# Patient Record
Sex: Female | Born: 1960
Health system: Southern US, Community
[De-identification: ages and names within clinical notes are randomized; demographics above are authoritative.]

## PROBLEM LIST (undated history)

## (undated) DIAGNOSIS — K219 Gastro-esophageal reflux disease without esophagitis: Secondary | ICD-10-CM

## (undated) DIAGNOSIS — E785 Hyperlipidemia, unspecified: Secondary | ICD-10-CM

## (undated) DIAGNOSIS — I2510 Atherosclerotic heart disease of native coronary artery without angina pectoris: Secondary | ICD-10-CM

## (undated) HISTORY — PX: ABDOMINAL HYSTERECTOMY: SHX81

## (undated) HISTORY — DX: Hyperlipidemia, unspecified: E78.5

## (undated) HISTORY — PX: CHOLECYSTECTOMY: SHX55

## (undated) HISTORY — DX: Gastro-esophageal reflux disease without esophagitis: K21.9

## (undated) HISTORY — PX: TONSILLECTOMY: SUR1361

## (undated) HISTORY — DX: Atherosclerotic heart disease of native coronary artery without angina pectoris: I25.10

---

## 2014-06-30 ENCOUNTER — Ambulatory Visit (INDEPENDENT_AMBULATORY_CARE_PROVIDER_SITE_OTHER): Payer: Federal, State, Local not specified - PPO

## 2014-06-30 DIAGNOSIS — R52 Pain, unspecified: Secondary | ICD-10-CM

## 2014-06-30 DIAGNOSIS — M7730 Calcaneal spur, unspecified foot: Secondary | ICD-10-CM

## 2014-06-30 DIAGNOSIS — M722 Plantar fascial fibromatosis: Secondary | ICD-10-CM

## 2014-06-30 MED ORDER — MELOXICAM 15 MG PO TABS: 15.0000 mg | ORAL_TABLET | Freq: Every day | ORAL | Status: DC

## 2014-06-30 NOTE — Progress Notes (Signed)
   Subjective:    Patient ID: Allison Pacheco, female    DOB: 04/15/1961, 54 y.o.   MRN: [id number]  HPI  PT STATED B/L BOTTOM OF THE FOOT ARE ACHING FOR 2 MONTHS. FEET BEEN THE SAME BUT SOMETIMES WHEN WALKING THEY GET WORSE. TRIED NO TREATMENT.  Review of Systems  All other systems reviewed and are negative.      Objective:   Physical Exam 54 year old white female well-developed well-nourished oriented 3 presents this time with bilateral mid arch pain. Been hurting for couple of months now has had plantar fasciitis treated more than 5 years ago wearing a pair of Brooks athletic shoes which are good and stable however around the house is wearing some memory foam slippers. Objective findings as follows vascular status is intact pedal pulses palpable DP and PT +2 over 4 bilateral capillary fill time 3 seconds all digits. Epicritic and proprioceptive sensations intact and symmetric bilateral there is normal plantar response DTRs not listed. Dermatologically skin color pigment normal hair growth absent nail somewhat criptotic incurvated otherwise unremarkable there is pain of the mid band plantar fascia from mid arch no significant pain in the calcaneal tubercle area x-rays reveal mild fascial thickening mid arch mid band there is some inferior calcaneal spurring no signs of fracture or osseous abnormality mild promontory changes noted clinically and radiographically.       Assessment & Plan:  Assessment this time findings consistent with plantar fasciitis/heel spur syndrome recurrence plan at this time fascial strapping applied to both feet maintaining good stable shoe avoid any barefoot flimsy shoes or flip-flops recheck in 2 weeks for follow-up may be candidate for orthoses based on progress and improvement. Also recommended ice to the heel area begin meloxicam once daily patient is also take an aspirin a day should not have major interference however discontinue if any GI upset other symptoms  develop.  Alvan Dameichard Arius Harnois DPM

## 2014-06-30 NOTE — Patient Instructions (Signed)

## 2014-07-15 ENCOUNTER — Ambulatory Visit: Payer: Federal, State, Local not specified - PPO

## 2014-07-29 ENCOUNTER — Ambulatory Visit (INDEPENDENT_AMBULATORY_CARE_PROVIDER_SITE_OTHER): Payer: Federal, State, Local not specified - PPO

## 2014-07-29 VITALS — BP 138/76 | HR 93 | Resp 18

## 2014-07-29 DIAGNOSIS — M7730 Calcaneal spur, unspecified foot: Secondary | ICD-10-CM

## 2014-07-29 DIAGNOSIS — R52 Pain, unspecified: Secondary | ICD-10-CM

## 2014-07-29 DIAGNOSIS — M722 Plantar fascial fibromatosis: Secondary | ICD-10-CM

## 2014-07-29 NOTE — Progress Notes (Signed)
   Subjective:    Patient ID: Wallene DalesMeledy Campoverde, female    DOB: 1961-06-06, 54 y.o.   MRN: [id number]  HPI BOTH OF MY HEELS ARE DOING BETTER AND GOT SOME BROOK SHOES AND THE TAPE DID HELP    Review of Systems no new findings or systemic changes are noted     Objective:   Physical Exam Neurovascular status is intact and unchanged pedal pulses are palpable on exam at this time there still some tenderness in the mid band plantar fascia bilateral heels left more so than right although greatly improved from initial presentation the strapping made a big difference when it was in place continue to take NSAIDs and maintaining ice as needed wearing a good pair of new balance athletic shoes at this time. Patient demonstrated significant improvement with biomechanical support of the taping would benefit from functional orthoses at this time. Patient is advised the medication and taping helped reduce the pain symptomology orthoses with beneficial in maintaining that improvement       Assessment & Plan:  Assessment plantar fasciitis/heel spur syndrome bilateral plan at this time based on fascial strapping success functional orthotics scan is carried out at this time and use sore with a type orthotic with extrinsic rear foot post pelite top cover and padding up into the toe extension to the toes is ordered a prescription for orthotic is given and instructions are given for orthotics scan open in 4 weeks for orthotic pickup and fitting once ready to dispensed next  Alvan Dameichard Issak Goley DPM

## 2014-07-29 NOTE — Patient Instructions (Signed)

## 2014-08-26 ENCOUNTER — Ambulatory Visit: Payer: Federal, State, Local not specified - PPO

## 2014-09-01 ENCOUNTER — Ambulatory Visit: Payer: Federal, State, Local not specified - PPO

## 2014-09-01 ENCOUNTER — Ambulatory Visit (INDEPENDENT_AMBULATORY_CARE_PROVIDER_SITE_OTHER): Payer: Federal, State, Local not specified - PPO

## 2014-09-01 VITALS — BP 110/61 | HR 82 | Resp 18

## 2014-09-01 DIAGNOSIS — R52 Pain, unspecified: Secondary | ICD-10-CM | POA: Diagnosis not present

## 2014-09-01 DIAGNOSIS — M7730 Calcaneal spur, unspecified foot: Secondary | ICD-10-CM | POA: Diagnosis not present

## 2014-09-01 DIAGNOSIS — M722 Plantar fascial fibromatosis: Secondary | ICD-10-CM

## 2014-09-01 NOTE — Patient Instructions (Addendum)
PATIENT CAME BY TOE GET THE INSERTS AND INSTRUCTIONS WERE GIVEN. LISAWEARING INSTRUCTIONS FOR ORTHOTICS  Don't expect to be comfortable wearing your orthotic devices for the first time.  Like eyeglasses, you may be aware of them as time passes, they will not be uncomfortable and you will enjoy wearing them.  FOLLOW THESE INSTRUCTIONS EXACTLY!  1. Wear your orthotic devices for:       Not more than 1 hour the first day.       Not more than 2 hours the second day.       Not more than 3 hours the third day and so on.        Or wear them for as long as they feel comfortable.       If you experience discomfort in your feet or legs take them out.  When feet & legs feel       better, put them back in.  You do need to be consistent and wear them a little        everyday. 2.   If at any time the orthotic devices become acutely uncomfortable before the       time for that particular day, STOP WEARING THEM. 3.   On the next day, do not increase the wearing time. 4.   Subsequently, increase the wearing time by 15-30 minutes only if comfortable to do       so. 5.   You will be seen by your doctor about 2-4 weeks after you receive your orthotic       devices, at which time you will probably be wearing your devices comfortably        for about 8 hours or more a day. 6.   Some patients occasionally report mild aches or discomfort in other parts of the of       body such as the knees, hips or back after 3 or 4 consecutive hours of wear.  If this       is the case with you, do not extend your wearing time.  Instead, cut it back an hour or       two.  In all likelihood, these symptoms will disappear in a short period of time as your       body posture realigns itself and functions more efficiently. 7.   It is possible that your orthotic device may require some small changes or adjustment       to improve their function or make them more comfortable.   This is usually not done       before one to three  months have elapsed.  These adjustments are made in        accordance with the changed position your feet are assuming as a result of       improved biomechanical function. 8.   In women's shoes, it's not unusual for your heel to slip out of the shoe, particularly if       they are step-in-shoes.  If this is the case, try other shoes or other styles.  Try to       purchase shoes which have deeper heal seats or higher heel counters. 9.   Squeaking of orthotics devices in the shoes is due to the movement of the devices       when they are functioning normally.  To eliminate squeaking, simply dust some       baby powder into your shoes before inserting the  devices.  If this does not work,        apply soap or wax to the edges of the orthotic devices or put a tissue into the shoes. 10. It is important that you follow these directions explicitly.  Failure to do so will simply       prolong the adjustment period or create problems which are easily avoided.  It makes       no difference if you are wearing your orthotic devices for only a few hours after        several months, so long as you are wearing them comfortably for those hours. 11. If you have any questions or complaints, contact our office.  We have no way of       knowing about your problems unless you tell us.  If we do not hear from you, we will       assume that you are proceeding well.

## 2014-09-01 NOTE — Progress Notes (Signed)
   Subjective:    Patient ID: Allison Pacheco, female    DOB: [birth year], 54 y.o.   MRN: [id number]  HPI I AM HERE TO GET MY INSERTS AND MY FEET ARE DOING GOOD    Review of Systems no new findings or systemic changes     Objective:   Physical Exam Neurovascular status unchanged pedal pulses are palpable epicritic sensations intact orthotics are dispensed with break in wearing instructions a fit and contour well with full contact of the foot and arch.       Assessment & Plan:  Assessment plantar fasciitis/heel spur syndrome responded the strapping should respond to functional orthoses orthotics are dispensed with break in wearing instructions were or written instructions are given at this time recheck in a month or 2 for adjustments if needed based on progress retained orthotics appropriate shoes at all times  Alvan Dameichard Cherylene Ferrufino DPM

## 2014-10-13 ENCOUNTER — Ambulatory Visit (INDEPENDENT_AMBULATORY_CARE_PROVIDER_SITE_OTHER): Payer: Federal, State, Local not specified - PPO | Admitting: Podiatrist

## 2014-10-13 ENCOUNTER — Encounter: Payer: Self-pay | Admitting: Podiatrist

## 2014-10-13 ENCOUNTER — Ambulatory Visit (INDEPENDENT_AMBULATORY_CARE_PROVIDER_SITE_OTHER): Payer: Federal, State, Local not specified - PPO

## 2014-10-13 VITALS — BP 117/76 | HR 89 | Resp 18

## 2014-10-13 DIAGNOSIS — R52 Pain, unspecified: Secondary | ICD-10-CM | POA: Diagnosis not present

## 2014-10-13 DIAGNOSIS — M25579 Pain in unspecified ankle and joints of unspecified foot: Secondary | ICD-10-CM

## 2014-10-13 DIAGNOSIS — M722 Plantar fascial fibromatosis: Secondary | ICD-10-CM

## 2014-10-13 MED ORDER — METHYLPREDNISOLONE 4 MG PO TBPK
ORAL_TABLET | ORAL | Status: DC
Start: 2014-10-13 — End: 2017-01-22

## 2014-10-13 NOTE — Progress Notes (Signed)
Chief Complaint  Patient presents with  . Foot Problem    my left foot is hurting and hurts on the arch and the side of my foot and has been going on for about 4 weeks        Objective:   Physical Exam Neurovascular status is intact and unchanged.  Pain on the lateral aspect of the left foot is noted. No mid foot arch pain is present. Compensation type his comfort is noted.      Assessment & Plan:  Assessment fasciitis with compensation lateral aspect left foot  Plan: Recommended adjustment of the orthotics. The arch height is about 116 inch higher on the left arch than the right which appears to be causing her to supinate more than she needs to. We'll going to deepen the heel seat and also add 1/8 inch heel lift on the right side. She'll be seen when the orthotics are ready for pickup.

## 2014-11-11 ENCOUNTER — Encounter: Payer: Self-pay | Admitting: Podiatrist

## 2014-11-11 ENCOUNTER — Ambulatory Visit (INDEPENDENT_AMBULATORY_CARE_PROVIDER_SITE_OTHER): Payer: Federal, State, Local not specified - PPO | Admitting: Podiatrist

## 2014-11-11 VITALS — BP 118/67 | HR 83 | Resp 18

## 2014-11-11 DIAGNOSIS — M722 Plantar fascial fibromatosis: Secondary | ICD-10-CM

## 2014-11-15 NOTE — Progress Notes (Signed)
Orthotics dispensed by ma.  I did not personally examine this patient and this was a no charge pick up inserts visit.

## 2015-12-21 ENCOUNTER — Encounter: Payer: Self-pay | Admitting: Cardiology

## 2015-12-21 DIAGNOSIS — E781 Pure hyperglyceridemia: Secondary | ICD-10-CM

## 2015-12-21 DIAGNOSIS — I2510 Atherosclerotic heart disease of native coronary artery without angina pectoris: Secondary | ICD-10-CM

## 2015-12-21 DIAGNOSIS — K219 Gastro-esophageal reflux disease without esophagitis: Secondary | ICD-10-CM

## 2015-12-21 DIAGNOSIS — E785 Hyperlipidemia, unspecified: Secondary | ICD-10-CM

## 2015-12-21 HISTORY — DX: Gastro-esophageal reflux disease without esophagitis: K21.9

## 2015-12-21 HISTORY — DX: Hyperlipidemia, unspecified: E78.5

## 2015-12-21 HISTORY — DX: Pure hyperglyceridemia: E78.1

## 2015-12-21 HISTORY — DX: Atherosclerotic heart disease of native coronary artery without angina pectoris: I25.10

## 2016-01-10 ENCOUNTER — Encounter: Payer: Self-pay | Admitting: Cardiology

## 2016-01-10 DIAGNOSIS — I2510 Atherosclerotic heart disease of native coronary artery without angina pectoris: Secondary | ICD-10-CM | POA: Diagnosis not present

## 2016-01-12 DIAGNOSIS — E781 Pure hyperglyceridemia: Secondary | ICD-10-CM | POA: Diagnosis not present

## 2016-01-12 DIAGNOSIS — E785 Hyperlipidemia, unspecified: Secondary | ICD-10-CM | POA: Diagnosis not present

## 2016-07-03 DIAGNOSIS — E785 Hyperlipidemia, unspecified: Secondary | ICD-10-CM | POA: Diagnosis not present

## 2016-07-03 DIAGNOSIS — I2510 Atherosclerotic heart disease of native coronary artery without angina pectoris: Secondary | ICD-10-CM | POA: Diagnosis not present

## 2016-07-03 DIAGNOSIS — E781 Pure hyperglyceridemia: Secondary | ICD-10-CM | POA: Diagnosis not present

## 2016-07-03 DIAGNOSIS — K219 Gastro-esophageal reflux disease without esophagitis: Secondary | ICD-10-CM | POA: Diagnosis not present

## 2016-07-05 DIAGNOSIS — R509 Fever, unspecified: Secondary | ICD-10-CM | POA: Diagnosis not present

## 2016-07-05 DIAGNOSIS — J0100 Acute maxillary sinusitis, unspecified: Secondary | ICD-10-CM | POA: Diagnosis not present

## 2016-07-05 DIAGNOSIS — J0110 Acute frontal sinusitis, unspecified: Secondary | ICD-10-CM | POA: Diagnosis not present

## 2016-09-03 DIAGNOSIS — E781 Pure hyperglyceridemia: Secondary | ICD-10-CM | POA: Diagnosis not present

## 2016-09-03 DIAGNOSIS — E785 Hyperlipidemia, unspecified: Secondary | ICD-10-CM | POA: Diagnosis not present

## 2016-09-03 DIAGNOSIS — I2510 Atherosclerotic heart disease of native coronary artery without angina pectoris: Secondary | ICD-10-CM | POA: Diagnosis not present

## 2016-09-03 DIAGNOSIS — K219 Gastro-esophageal reflux disease without esophagitis: Secondary | ICD-10-CM | POA: Diagnosis not present

## 2016-10-03 DIAGNOSIS — J0100 Acute maxillary sinusitis, unspecified: Secondary | ICD-10-CM | POA: Diagnosis not present

## 2016-10-03 DIAGNOSIS — J209 Acute bronchitis, unspecified: Secondary | ICD-10-CM | POA: Diagnosis not present

## 2016-10-17 DIAGNOSIS — E785 Hyperlipidemia, unspecified: Secondary | ICD-10-CM | POA: Diagnosis not present

## 2017-01-29 ENCOUNTER — Ambulatory Visit (INDEPENDENT_AMBULATORY_CARE_PROVIDER_SITE_OTHER): Payer: Federal, State, Local not specified - PPO | Admitting: Cardiology

## 2017-01-29 ENCOUNTER — Encounter: Payer: Self-pay | Admitting: Cardiology

## 2017-01-29 VITALS — BP 110/66 | HR 80 | Resp 10 | Ht 64.0 in | Wt 179.8 lb

## 2017-01-29 DIAGNOSIS — E781 Pure hyperglyceridemia: Secondary | ICD-10-CM

## 2017-01-29 DIAGNOSIS — I2510 Atherosclerotic heart disease of native coronary artery without angina pectoris: Secondary | ICD-10-CM | POA: Diagnosis not present

## 2017-01-29 DIAGNOSIS — K219 Gastro-esophageal reflux disease without esophagitis: Secondary | ICD-10-CM

## 2017-01-29 DIAGNOSIS — E785 Hyperlipidemia, unspecified: Secondary | ICD-10-CM | POA: Diagnosis not present

## 2017-01-29 NOTE — Progress Notes (Signed)
Cardiology Office Note:    Date:  01/29/2017   ID:  Allison Pacheco, DOB [birth year], MRN [id number]  PCP:  Patient, No Pcp Per  Cardiologist:  Gypsy Balsam, MD    Referring MD: No ref. provider found   Chief Complaint  Patient presents with  . Follow-up  Remote coronary artery disease  History of Present Illness:    Allison Pacheco is a 56 y.o. female  with past medical history significant for remote coronary artery disease. She was fine to have occluded distal left anterior descending artery was in the 90s. She is doing great she lost 36 pounds. She goes to gym on a regular basis, she also states with very good diet which is high in protein low in carbohydrates. She is doing very well denies having any chest pain, tightness, squeezing, pressure, burning in the chest.  Past Medical History:  Diagnosis Date  . Coronary artery disease   . GERD (gastroesophageal reflux disease)   . Hyperlipidemia     Past Surgical History:  Procedure Laterality Date  . ABDOMINAL HYSTERECTOMY    . CHOLECYSTECTOMY    . TONSILLECTOMY      Current Medications: No outpatient prescriptions have been marked as taking for the 01/29/17 encounter (Office Visit) with Georgeanna Lea, MD.     Allergies:   Codeine   Social History   Social History  . Marital status: Unknown    Spouse name: N/A  . Number of children: N/A  . Years of education: N/A   Social History Main Topics  . Smoking status: Never Smoker  . Smokeless tobacco: Never Used  . Alcohol use No  . Drug use: No  . Sexual activity: Not Asked   Other Topics Concern  . None   Social History Narrative  . None     Family History: The patient's family history includes Cancer in her father. ROS:   Please see the history of present illness.    All 14 point review of systems negative except as described per history of present illness  EKGs/Labs/Other Studies Reviewed:      Recent Labs: No results found for requested labs  within last 8760 hours.  Recent Lipid Panel No results found for: CHOL, TRIG, HDL, CHOLHDL, VLDL, LDLCALC, LDLDIRECT  Physical Exam:    VS:  BP 110/66   Pulse 80   Resp 10   Ht 5\' 4"  (1.626 m)   Wt 179 lb 12.8 oz (81.6 kg)   BMI 30.86 kg/m     Wt Readings from Last 3 Encounters:  01/29/17 179 lb 12.8 oz (81.6 kg)     GEN:  Well nourished, well developed in no acute distress HEENT: Normal NECK: No JVD; No carotid bruits LYMPHATICS: No lymphadenopathy CARDIAC: RRR, no murmurs, no rubs, no gallops RESPIRATORY:  Clear to auscultation without rales, wheezing or rhonchi  ABDOMEN: Soft, non-tender, non-distended MUSCULOSKELETAL:  No edema; No deformity  SKIN: Warm and dry LOWER EXTREMITIES: no swelling NEUROLOGIC:  Alert and oriented x 3 PSYCHIATRIC:  Normal affect   ASSESSMENT:    1. Coronary artery disease involving native coronary artery of native heart without angina pectoris   2. Gastroesophageal reflux disease without esophagitis   3. Dyslipidemia   4. Hypertriglyceridemia    PLAN:    In order of problems listed above:  1. Coronary artery disease: Stable, asymptomatic and appropriate medications. I'll retrieve her echocardiogram from our OFFICE to see what her ejection fraction was to see if there is any point  of getting her ACE inhibitor. 2. Dyslipidemia: Taking statin which I will continue. I'll call primary care physician to get fasting profile. 3. Gastroesophageal reflux disease: Denies having any.   Medication Adjustments/Labs and Tests Ordered: Current medicines are reviewed at length with the patient today.  Concerns regarding medicines are outlined above.  No orders of the defined types were placed in this encounter.  Medication changes: No orders of the defined types were placed in this encounter.   Signed, Georgeanna Leaobert J. Krasowski, MD, [facility] 01/29/2017 2:36 PM    [facility]

## 2017-04-09 DIAGNOSIS — M25511 Pain in right shoulder: Secondary | ICD-10-CM | POA: Diagnosis not present

## 2017-04-16 DIAGNOSIS — M7541 Impingement syndrome of right shoulder: Secondary | ICD-10-CM | POA: Diagnosis not present

## 2017-04-16 DIAGNOSIS — M25511 Pain in right shoulder: Secondary | ICD-10-CM | POA: Diagnosis not present

## 2017-04-22 DIAGNOSIS — M25511 Pain in right shoulder: Secondary | ICD-10-CM | POA: Diagnosis not present

## 2017-04-22 DIAGNOSIS — M7541 Impingement syndrome of right shoulder: Secondary | ICD-10-CM | POA: Diagnosis not present

## 2017-04-25 DIAGNOSIS — M25511 Pain in right shoulder: Secondary | ICD-10-CM | POA: Diagnosis not present

## 2017-04-25 DIAGNOSIS — M7541 Impingement syndrome of right shoulder: Secondary | ICD-10-CM | POA: Diagnosis not present

## 2017-04-30 DIAGNOSIS — M25511 Pain in right shoulder: Secondary | ICD-10-CM | POA: Diagnosis not present

## 2017-04-30 DIAGNOSIS — M7541 Impingement syndrome of right shoulder: Secondary | ICD-10-CM | POA: Diagnosis not present

## 2017-05-21 DIAGNOSIS — M7541 Impingement syndrome of right shoulder: Secondary | ICD-10-CM | POA: Diagnosis not present

## 2017-06-08 DIAGNOSIS — J0100 Acute maxillary sinusitis, unspecified: Secondary | ICD-10-CM | POA: Diagnosis not present

## 2017-06-24 HISTORY — PX: SHOULDER SURGERY: SHX246

## 2017-07-03 DIAGNOSIS — H02889 Meibomian gland dysfunction of unspecified eye, unspecified eyelid: Secondary | ICD-10-CM | POA: Diagnosis not present

## 2017-07-31 DIAGNOSIS — H04123 Dry eye syndrome of bilateral lacrimal glands: Secondary | ICD-10-CM | POA: Diagnosis not present

## 2017-12-16 DIAGNOSIS — L718 Other rosacea: Secondary | ICD-10-CM | POA: Diagnosis not present

## 2018-01-14 DIAGNOSIS — M25511 Pain in right shoulder: Secondary | ICD-10-CM | POA: Diagnosis not present

## 2018-01-16 DIAGNOSIS — M25511 Pain in right shoulder: Secondary | ICD-10-CM | POA: Diagnosis not present

## 2018-01-22 ENCOUNTER — Encounter: Payer: Self-pay | Admitting: Sports Medicine

## 2018-01-22 ENCOUNTER — Ambulatory Visit: Payer: Federal, State, Local not specified - PPO | Admitting: Sports Medicine

## 2018-01-22 DIAGNOSIS — M75121 Complete rotator cuff tear or rupture of right shoulder, not specified as traumatic: Secondary | ICD-10-CM | POA: Diagnosis not present

## 2018-01-22 DIAGNOSIS — M79675 Pain in left toe(s): Secondary | ICD-10-CM

## 2018-01-22 DIAGNOSIS — L03032 Cellulitis of left toe: Secondary | ICD-10-CM | POA: Diagnosis not present

## 2018-01-22 MED ORDER — AMOXICILLIN-POT CLAVULANATE 875-125 MG PO TABS: 1.0000 | ORAL_TABLET | Freq: Two times a day (BID) | ORAL | 0 refills | Status: DC

## 2018-01-22 NOTE — Patient Instructions (Addendum)

## 2018-01-22 NOTE — Progress Notes (Signed)
Subjective: Allison Pacheco is a 57 y.o. female patient presents to office today complaining of a moderately painful incurvated, red, hot, swollen lateral nail border of the 1st toe on the left foot. This has been present for 6 months. Patient has treated this by using peroxide to the area. Patient denies fever/chills/nausea/vomitting/any other related constitutional symptoms at this time.  Admits to a cardiac history with a heart attack in 1996 currently on full dose aspirin however denies history of diabetes numbness or tingling in foot or any problems healing after procedures.  Review of Systems  Skin:       Pain and swelling around left toenail  All other systems reviewed and are negative.    Patient Active Problem List   Diagnosis Date Noted  . Coronary artery disease involving native coronary artery of native heart without angina pectoris 12/21/2015  . Dyslipidemia 12/21/2015  . Gastroesophageal reflux disease without esophagitis 12/21/2015  . Hypertriglyceridemia 12/21/2015    Current Outpatient Medications on File Prior to Visit  Medication Sig Dispense Refill  . simvastatin (ZOCOR) 80 MG tablet Take 80 mg by mouth daily.     No current facility-administered medications on file prior to visit.     Allergies  Allergen Reactions  . Codeine     Objective:  There were no vitals filed for this visit.  General: Well developed, nourished, in no acute distress, alert and oriented x3   Dermatology: Skin is warm, dry and supple bilateral.  Left hallux nail appears to be  severely incurvated with hyperkeratosis formation at the distal aspects of  the lateral nail border that is advancing on to the center portion of the nail. (+) Erythema. (+) Edema. (+) serosanguous  drainage present. The remaining nails appear unremarkable at this time. There are no open sores, lesions or other signs of infection present.  Vascular: Dorsalis Pedis artery and Posterior Tibial artery pedal pulses  are 2/4 bilateral with immedate capillary fill time. Pedal hair growth present. No lower extremity edema.   Neruologic: Grossly intact via light touch bilateral.  Musculoskeletal: Tenderness to palpation of the left hallux lateral nail fold.  Muscular strength within normal limits in all groups bilateral.   Assesement and Plan: Problem List Items Addressed This Visit    None    Visit Diagnoses    Paronychia of toe, left    -  Primary   Toe pain, left          -Discussed treatment alternatives and plan of care; Explained temporary nail avulsion and post procedure course to patient. - After a verbal and written consent, injected 3 ml of a 50:50 mixture of 2% plain  lidocaine and 0.5% plain marcaine in a normal hallux block fashion. Next, a  betadine prep was performed. Anesthesia was tested and found to be appropriate.  The offending left hallux nail was then incised from the hyponychium to the epinychium. The nail was removed and cleared from the field. The area was curretted for any remaining nail or spicules and then flushed with alcohol and dressed with antibiotic cream and a dry sterile dressing. -Patient was instructed to leave the dressing intact for today and begin soaking  in a weak solution of betadine or Epsom salt and water tomorrow. Patient was instructed to  soak for 15 minutes each day and apply neosporin and a gauze or bandaid dressing each day. -Patient was instructed to monitor the toe for signs of infection and return to office if toe becomes red, hot  or swollen. -Prescribed Augmentin for preventative measures -Advised ice, elevation, and tylenol or motrin if needed for pain.  -Patient is to return in 2 weeks for follow up care/nail check or sooner if problems arise.  Asencion Islam, DPM

## 2018-01-27 ENCOUNTER — Encounter: Payer: Self-pay | Admitting: Cardiology

## 2018-01-27 ENCOUNTER — Ambulatory Visit (INDEPENDENT_AMBULATORY_CARE_PROVIDER_SITE_OTHER): Payer: Federal, State, Local not specified - PPO | Admitting: Cardiology

## 2018-01-27 VITALS — BP 130/72 | HR 96 | Ht 64.0 in | Wt 194.0 lb

## 2018-01-27 DIAGNOSIS — K219 Gastro-esophageal reflux disease without esophagitis: Secondary | ICD-10-CM

## 2018-01-27 DIAGNOSIS — E785 Hyperlipidemia, unspecified: Secondary | ICD-10-CM | POA: Diagnosis not present

## 2018-01-27 DIAGNOSIS — I2510 Atherosclerotic heart disease of native coronary artery without angina pectoris: Secondary | ICD-10-CM | POA: Diagnosis not present

## 2018-01-27 DIAGNOSIS — E781 Pure hyperglyceridemia: Secondary | ICD-10-CM

## 2018-01-27 MED ORDER — ASPIRIN EC 81 MG PO TBEC: 81.0000 mg | DELAYED_RELEASE_TABLET | Freq: Every day | ORAL | 3 refills | Status: DC

## 2018-01-27 NOTE — Progress Notes (Signed)
Cardiology Office Note:    Date:  01/27/2018   ID:  Allison Pacheco, DOB 03-Dec-1960, MRN [id number]  PCP:  Patient, No Pcp Per  Cardiologist:  Gypsy Balsam, MD    Referring MD: No ref. provider found   Chief Complaint  Patient presents with  . Pre-op Exam    Rt shoulder repair (rotater cuff)  I need shoulder surgery  History of Present Illness:    Allison Pacheco is a 57 y.o. female with coronary artery disease 1997 she had cardiac catheterization which revealed completely occluded distal left anterior descending artery she required right shoulder surgery which will be done under general anesthesia she is here to be evaluated before the surgery luckily she is very active she exercised on the regular basis she goes to gym on the regular basis and she walks on the treadmill and run on the treadmill for half an hour maximal speed is 6 mph she has no difficulty doing this there is no chest pain tightness squeezing pressure burning chest pain for from my cardiac point review she is acceptable candidate for surgery as scheduled.  Past Medical History:  Diagnosis Date  . Coronary artery disease   . GERD (gastroesophageal reflux disease)   . Hyperlipidemia     Past Surgical History:  Procedure Laterality Date  . ABDOMINAL HYSTERECTOMY    . CHOLECYSTECTOMY    . TONSILLECTOMY      Current Medications: Current Meds  Medication Sig  . amoxicillin-clavulanate (AUGMENTIN) 875-125 MG tablet Take 1 tablet by mouth 2 (two) times daily.  Marland Kitchen aspirin 325 MG tablet Take 325 mg by mouth daily.  . simvastatin (ZOCOR) 80 MG tablet Take 80 mg by mouth daily.     Allergies:   Codeine   Social History   Socioeconomic History  . Marital status: Unknown    Spouse name: Not on file  . Number of children: Not on file  . Years of education: Not on file  . Highest education level: Not on file  Occupational History  . Not on file  Social Needs  . Financial resource strain: Not on file  . Food  insecurity:    Worry: Not on file    Inability: Not on file  . Transportation needs:    Medical: Not on file    Non-medical: Not on file  Tobacco Use  . Smoking status: Never Smoker  . Smokeless tobacco: Never Used  Substance and Sexual Activity  . Alcohol use: No    Alcohol/week: 0.0 oz  . Drug use: No  . Sexual activity: Not on file  Lifestyle  . Physical activity:    Days per week: Not on file    Minutes per session: Not on file  . Stress: Not on file  Relationships  . Social connections:    Talks on phone: Not on file    Gets together: Not on file    Attends religious service: Not on file    Active member of club or organization: Not on file    Attends meetings of clubs or organizations: Not on file    Relationship status: Not on file  Other Topics Concern  . Not on file  Social History Narrative  . Not on file     Family History: The patient's family history includes Cancer in her father. ROS:   Please see the history of present illness.    All 14 point review of systems negative except as described per history of present illness  EKGs/Labs/Other  Studies Reviewed:    EKG showed normal sinus rhythm normal P interval normal QS complex duration morphology no ST segment changes  Recent Labs: No results found for requested labs within last 8760 hours.  Recent Lipid Panel No results found for: CHOL, TRIG, HDL, CHOLHDL, VLDL, LDLCALC, LDLDIRECT  Physical Exam:    VS:  BP 130/72 (BP Location: Left Arm, Patient Position: Sitting, Cuff Size: Large)   Pulse 96   Ht 5\' 4"  (1.626 m)   Wt 194 lb (88 kg)   SpO2 98%   BMI 33.30 kg/m     Wt Readings from Last 3 Encounters:  01/27/18 194 lb (88 kg)  01/29/17 179 lb 12.8 oz (81.6 kg)     GEN:  Well nourished, well developed in no acute distress HEENT: Normal NECK: No JVD; No carotid bruits LYMPHATICS: No lymphadenopathy CARDIAC: RRR, no murmurs, no rubs, no gallops RESPIRATORY:  Clear to auscultation without  rales, wheezing or rhonchi  ABDOMEN: Soft, non-tender, non-distended MUSCULOSKELETAL:  No edema; No deformity  SKIN: Warm and dry LOWER EXTREMITIES: no swelling NEUROLOGIC:  Alert and oriented x 3 PSYCHIATRIC:  Normal affect   ASSESSMENT:    1. Coronary artery disease involving native coronary artery of native heart without angina pectoris   2. Gastroesophageal reflux disease without esophagitis   3. Dyslipidemia   4. Hypertriglyceridemia    PLAN:    In order of problems listed above:  1. Coronary artery disease: Doing well from that point review exercise on the regular basis with no difficulties gastroesophageal reflux disease stable from that point review  Dyslipidemia: We will do fasting lipid profile today Hypertriglyceridemia again fasting lipid profile will be done today.   Medication Adjustments/Labs and Tests Ordered: Current medicines are reviewed at length with the patient today.  Concerns regarding medicines are outlined above.  No orders of the defined types were placed in this encounter.  Medication changes: No orders of the defined types were placed in this encounter.   Signed, Georgeanna Leaobert J. Joh Rao, MD, [facility] 01/27/2018 4:04 PM    [facility]

## 2018-01-27 NOTE — Patient Instructions (Addendum)
Medication Instructions:  Your physician has recommended you make the following change in your medication:   DECREASE: Aspirin to 81 mg daily.   Labwork: Your physician recommends that you return for lab work today: Liver function tests and lipids.   Testing/Procedures: None.  Follow-Up: Your physician wants you to follow-up in: 4 months. You will receive a reminder letter in the mail two months in advance. If you don't receive a letter, please call our office to schedule the follow-up appointment.   Any Other Special Instructions Will Be Listed Below (If Applicable).     If you need a refill on your cardiac medications before your next appointment, please call your pharmacy.

## 2018-01-28 LAB — LIPID PANEL
CHOL/HDL RATIO: 5 ratio — AB (ref 0.0–4.4)
Cholesterol, Total: 181 mg/dL (ref 100–199)
HDL: 36 mg/dL — ABNORMAL LOW (ref >39)
LDL Calculated: 81 mg/dL (ref 0–99)
Triglycerides: 321 mg/dL — ABNORMAL HIGH (ref 0–149)
VLDL Cholesterol Cal: 64 mg/dL — ABNORMAL HIGH (ref 5–40)

## 2018-01-28 LAB — HEPATIC FUNCTION PANEL
ALBUMIN: 4.5 g/dL (ref 3.5–5.5)
ALK PHOS: 76 IU/L (ref 39–117)
ALT: 13 IU/L (ref 0–32)
AST: 18 IU/L (ref 0–40)
BILIRUBIN, DIRECT: 0.09 mg/dL (ref 0.00–0.40)
Bilirubin Total: 0.2 mg/dL (ref 0.0–1.2)
Total Protein: 7.1 g/dL (ref 6.0–8.5)

## 2018-02-02 DIAGNOSIS — M75121 Complete rotator cuff tear or rupture of right shoulder, not specified as traumatic: Secondary | ICD-10-CM | POA: Diagnosis not present

## 2018-02-03 DIAGNOSIS — M67921 Unspecified disorder of synovium and tendon, right upper arm: Secondary | ICD-10-CM | POA: Diagnosis not present

## 2018-02-03 DIAGNOSIS — M75121 Complete rotator cuff tear or rupture of right shoulder, not specified as traumatic: Secondary | ICD-10-CM | POA: Diagnosis not present

## 2018-02-03 DIAGNOSIS — M6788 Other specified disorders of synovium and tendon, other site: Secondary | ICD-10-CM | POA: Diagnosis not present

## 2018-02-03 DIAGNOSIS — M7541 Impingement syndrome of right shoulder: Secondary | ICD-10-CM | POA: Diagnosis not present

## 2018-02-03 DIAGNOSIS — I252 Old myocardial infarction: Secondary | ICD-10-CM | POA: Diagnosis not present

## 2018-02-03 DIAGNOSIS — G8918 Other acute postprocedural pain: Secondary | ICD-10-CM | POA: Diagnosis not present

## 2018-02-03 DIAGNOSIS — M7521 Bicipital tendinitis, right shoulder: Secondary | ICD-10-CM | POA: Diagnosis not present

## 2018-02-03 DIAGNOSIS — E7800 Pure hypercholesterolemia, unspecified: Secondary | ICD-10-CM | POA: Diagnosis not present

## 2018-02-03 DIAGNOSIS — Z79899 Other long term (current) drug therapy: Secondary | ICD-10-CM | POA: Diagnosis not present

## 2018-02-03 DIAGNOSIS — M94211 Chondromalacia, right shoulder: Secondary | ICD-10-CM | POA: Diagnosis not present

## 2018-02-03 DIAGNOSIS — E785 Hyperlipidemia, unspecified: Secondary | ICD-10-CM | POA: Diagnosis not present

## 2018-02-03 DIAGNOSIS — Z7982 Long term (current) use of aspirin: Secondary | ICD-10-CM | POA: Diagnosis not present

## 2018-02-03 DIAGNOSIS — I2510 Atherosclerotic heart disease of native coronary artery without angina pectoris: Secondary | ICD-10-CM | POA: Diagnosis not present

## 2018-02-03 DIAGNOSIS — K219 Gastro-esophageal reflux disease without esophagitis: Secondary | ICD-10-CM | POA: Diagnosis not present

## 2018-02-04 ENCOUNTER — Ambulatory Visit: Payer: Federal, State, Local not specified - PPO | Admitting: Sports Medicine

## 2018-02-12 ENCOUNTER — Encounter: Payer: Self-pay | Admitting: Sports Medicine

## 2018-02-12 ENCOUNTER — Ambulatory Visit (INDEPENDENT_AMBULATORY_CARE_PROVIDER_SITE_OTHER): Payer: Federal, State, Local not specified - PPO | Admitting: Sports Medicine

## 2018-02-12 DIAGNOSIS — M79675 Pain in left toe(s): Secondary | ICD-10-CM

## 2018-02-12 DIAGNOSIS — L03032 Cellulitis of left toe: Secondary | ICD-10-CM

## 2018-02-12 NOTE — Patient Instructions (Signed)

## 2018-02-12 NOTE — Progress Notes (Signed)
Subjective: Allison Pacheco is a 57 y.o. female patient returns to office today for follow up evaluation after having left hallux total temporary nail avulsion performed on 01/22/2018. Patient has been soaking using epsom salt and applying topical antibiotic covered with bandaid daily. Patient denies fever/chills/nausea/vomitting/any other related constitutional symptoms at this time.  S/p Shoulder surgery on right.   Patient Active Problem List   Diagnosis Date Noted  . Coronary artery disease involving native coronary artery of native heart without angina pectoris 12/21/2015  . Dyslipidemia 12/21/2015  . Gastroesophageal reflux disease without esophagitis 12/21/2015  . Hypertriglyceridemia 12/21/2015    Current Outpatient Medications on File Prior to Visit  Medication Sig Dispense Refill  . amoxicillin-clavulanate (AUGMENTIN) 875-125 MG tablet Take 1 tablet by mouth 2 (two) times daily. 28 tablet 0  . aspirin EC 81 MG tablet Take 1 tablet (81 mg total) by mouth daily. 90 tablet 3  . simvastatin (ZOCOR) 80 MG tablet Take 80 mg by mouth daily.     No current facility-administered medications on file prior to visit.     Allergies  Allergen Reactions  . Codeine     Objective:  General: Well developed, nourished, in no acute distress, alert and oriented x3   Dermatology: Skin is warm, dry and supple bilateral.  Left hallux nail bed appears to be clean, dry, with mild granular tissue and surrounding eschar/scab. (-) Erythema. (-) Edema. (-) serosanguous drainage present. The remaining nails appear unremarkable at this time. There are no other lesions or other signs of infection present.  Neurovascular status: Intact. No lower extremity swelling; No pain with calf compression bilateral.  Musculoskeletal: Decreased tenderness to palpation of the Left hallux. Muscular strength within normal limits bilateral.   Assesement and Plan: Problem List Items Addressed This Visit    None    Visit  Diagnoses    Paronychia of toe, left    -  Primary   Toe pain, left          -Examined patient  -Cleansed Left hallux nail bed and gently scrubbed with peroxide and q-tip/curetted away eschar at site and applied antibiotic cream covered with bandaid.  -Discussed plan of care with patient. -Patient to d/c soaking in a weak solution of Epsom salt and warm water.  May leave open to air at night. -Educated patient on long term care after nail surgery. -Patient was instructed to monitor the toe for reoccurrence and signs of infection; Patient advised to return to office or go to ER if toe becomes red, hot or swollen. -Patient is to return as needed or sooner if problems arise.  Asencion Islamitorya Dorreen Valiente, DPM

## 2018-03-17 DIAGNOSIS — R2689 Other abnormalities of gait and mobility: Secondary | ICD-10-CM | POA: Diagnosis not present

## 2018-03-17 DIAGNOSIS — M25511 Pain in right shoulder: Secondary | ICD-10-CM | POA: Diagnosis not present

## 2018-03-18 DIAGNOSIS — R2689 Other abnormalities of gait and mobility: Secondary | ICD-10-CM | POA: Diagnosis not present

## 2018-03-18 DIAGNOSIS — M25511 Pain in right shoulder: Secondary | ICD-10-CM | POA: Diagnosis not present

## 2018-03-25 DIAGNOSIS — R2689 Other abnormalities of gait and mobility: Secondary | ICD-10-CM | POA: Diagnosis not present

## 2018-03-25 DIAGNOSIS — M25511 Pain in right shoulder: Secondary | ICD-10-CM | POA: Diagnosis not present

## 2018-03-27 DIAGNOSIS — R2689 Other abnormalities of gait and mobility: Secondary | ICD-10-CM | POA: Diagnosis not present

## 2018-03-27 DIAGNOSIS — M25511 Pain in right shoulder: Secondary | ICD-10-CM | POA: Diagnosis not present

## 2018-04-01 DIAGNOSIS — R2689 Other abnormalities of gait and mobility: Secondary | ICD-10-CM | POA: Diagnosis not present

## 2018-04-01 DIAGNOSIS — M25511 Pain in right shoulder: Secondary | ICD-10-CM | POA: Diagnosis not present

## 2018-04-02 DIAGNOSIS — R2689 Other abnormalities of gait and mobility: Secondary | ICD-10-CM | POA: Diagnosis not present

## 2018-04-02 DIAGNOSIS — M25511 Pain in right shoulder: Secondary | ICD-10-CM | POA: Diagnosis not present

## 2018-04-06 DIAGNOSIS — R2689 Other abnormalities of gait and mobility: Secondary | ICD-10-CM | POA: Diagnosis not present

## 2018-04-06 DIAGNOSIS — M25511 Pain in right shoulder: Secondary | ICD-10-CM | POA: Diagnosis not present

## 2018-04-09 DIAGNOSIS — M25511 Pain in right shoulder: Secondary | ICD-10-CM | POA: Diagnosis not present

## 2018-04-09 DIAGNOSIS — R2689 Other abnormalities of gait and mobility: Secondary | ICD-10-CM | POA: Diagnosis not present

## 2018-04-14 DIAGNOSIS — R2689 Other abnormalities of gait and mobility: Secondary | ICD-10-CM | POA: Diagnosis not present

## 2018-04-14 DIAGNOSIS — M25511 Pain in right shoulder: Secondary | ICD-10-CM | POA: Diagnosis not present

## 2018-04-16 DIAGNOSIS — R2689 Other abnormalities of gait and mobility: Secondary | ICD-10-CM | POA: Diagnosis not present

## 2018-04-16 DIAGNOSIS — M25511 Pain in right shoulder: Secondary | ICD-10-CM | POA: Diagnosis not present

## 2018-04-21 DIAGNOSIS — M25511 Pain in right shoulder: Secondary | ICD-10-CM | POA: Diagnosis not present

## 2018-04-21 DIAGNOSIS — R2689 Other abnormalities of gait and mobility: Secondary | ICD-10-CM | POA: Diagnosis not present

## 2018-04-23 ENCOUNTER — Ambulatory Visit: Payer: Federal, State, Local not specified - PPO | Admitting: Sports Medicine

## 2018-04-23 DIAGNOSIS — M25511 Pain in right shoulder: Secondary | ICD-10-CM | POA: Diagnosis not present

## 2018-04-23 DIAGNOSIS — R2689 Other abnormalities of gait and mobility: Secondary | ICD-10-CM | POA: Diagnosis not present

## 2018-04-29 DIAGNOSIS — M25511 Pain in right shoulder: Secondary | ICD-10-CM | POA: Diagnosis not present

## 2018-04-29 DIAGNOSIS — R2689 Other abnormalities of gait and mobility: Secondary | ICD-10-CM | POA: Diagnosis not present

## 2018-05-01 DIAGNOSIS — M25511 Pain in right shoulder: Secondary | ICD-10-CM | POA: Diagnosis not present

## 2018-05-01 DIAGNOSIS — R2689 Other abnormalities of gait and mobility: Secondary | ICD-10-CM | POA: Diagnosis not present

## 2018-05-26 DIAGNOSIS — M25511 Pain in right shoulder: Secondary | ICD-10-CM | POA: Diagnosis not present

## 2018-11-18 DIAGNOSIS — M75121 Complete rotator cuff tear or rupture of right shoulder, not specified as traumatic: Secondary | ICD-10-CM | POA: Diagnosis not present

## 2019-01-24 DIAGNOSIS — R58 Hemorrhage, not elsewhere classified: Secondary | ICD-10-CM | POA: Diagnosis not present

## 2019-01-24 DIAGNOSIS — S2001XA Contusion of right breast, initial encounter: Secondary | ICD-10-CM | POA: Diagnosis not present

## 2019-06-10 DIAGNOSIS — M75121 Complete rotator cuff tear or rupture of right shoulder, not specified as traumatic: Secondary | ICD-10-CM | POA: Diagnosis not present

## 2019-07-07 DIAGNOSIS — M4003 Postural kyphosis, cervicothoracic region: Secondary | ICD-10-CM | POA: Diagnosis not present

## 2019-07-07 DIAGNOSIS — M9901 Segmental and somatic dysfunction of cervical region: Secondary | ICD-10-CM | POA: Diagnosis not present

## 2019-07-07 DIAGNOSIS — M9902 Segmental and somatic dysfunction of thoracic region: Secondary | ICD-10-CM | POA: Diagnosis not present

## 2019-07-07 DIAGNOSIS — M50322 Other cervical disc degeneration at C5-C6 level: Secondary | ICD-10-CM | POA: Diagnosis not present

## 2019-07-15 DIAGNOSIS — M9901 Segmental and somatic dysfunction of cervical region: Secondary | ICD-10-CM | POA: Diagnosis not present

## 2019-07-15 DIAGNOSIS — M9902 Segmental and somatic dysfunction of thoracic region: Secondary | ICD-10-CM | POA: Diagnosis not present

## 2019-07-15 DIAGNOSIS — M4003 Postural kyphosis, cervicothoracic region: Secondary | ICD-10-CM | POA: Diagnosis not present

## 2019-07-15 DIAGNOSIS — M50322 Other cervical disc degeneration at C5-C6 level: Secondary | ICD-10-CM | POA: Diagnosis not present

## 2019-07-19 DIAGNOSIS — M4003 Postural kyphosis, cervicothoracic region: Secondary | ICD-10-CM | POA: Diagnosis not present

## 2019-07-19 DIAGNOSIS — M9901 Segmental and somatic dysfunction of cervical region: Secondary | ICD-10-CM | POA: Diagnosis not present

## 2019-07-19 DIAGNOSIS — M50322 Other cervical disc degeneration at C5-C6 level: Secondary | ICD-10-CM | POA: Diagnosis not present

## 2019-07-19 DIAGNOSIS — M9902 Segmental and somatic dysfunction of thoracic region: Secondary | ICD-10-CM | POA: Diagnosis not present

## 2019-07-21 DIAGNOSIS — M9901 Segmental and somatic dysfunction of cervical region: Secondary | ICD-10-CM | POA: Diagnosis not present

## 2019-07-21 DIAGNOSIS — M50322 Other cervical disc degeneration at C5-C6 level: Secondary | ICD-10-CM | POA: Diagnosis not present

## 2019-07-21 DIAGNOSIS — M9902 Segmental and somatic dysfunction of thoracic region: Secondary | ICD-10-CM | POA: Diagnosis not present

## 2019-07-21 DIAGNOSIS — M4003 Postural kyphosis, cervicothoracic region: Secondary | ICD-10-CM | POA: Diagnosis not present

## 2019-07-22 DIAGNOSIS — M9902 Segmental and somatic dysfunction of thoracic region: Secondary | ICD-10-CM | POA: Diagnosis not present

## 2019-07-22 DIAGNOSIS — M50322 Other cervical disc degeneration at C5-C6 level: Secondary | ICD-10-CM | POA: Diagnosis not present

## 2019-07-22 DIAGNOSIS — M4003 Postural kyphosis, cervicothoracic region: Secondary | ICD-10-CM | POA: Diagnosis not present

## 2019-07-22 DIAGNOSIS — M9901 Segmental and somatic dysfunction of cervical region: Secondary | ICD-10-CM | POA: Diagnosis not present

## 2019-07-28 DIAGNOSIS — M9901 Segmental and somatic dysfunction of cervical region: Secondary | ICD-10-CM | POA: Diagnosis not present

## 2019-07-28 DIAGNOSIS — M9902 Segmental and somatic dysfunction of thoracic region: Secondary | ICD-10-CM | POA: Diagnosis not present

## 2019-07-28 DIAGNOSIS — M4003 Postural kyphosis, cervicothoracic region: Secondary | ICD-10-CM | POA: Diagnosis not present

## 2019-07-28 DIAGNOSIS — M50322 Other cervical disc degeneration at C5-C6 level: Secondary | ICD-10-CM | POA: Diagnosis not present

## 2019-08-05 DIAGNOSIS — M50322 Other cervical disc degeneration at C5-C6 level: Secondary | ICD-10-CM | POA: Diagnosis not present

## 2019-08-05 DIAGNOSIS — M4003 Postural kyphosis, cervicothoracic region: Secondary | ICD-10-CM | POA: Diagnosis not present

## 2019-08-05 DIAGNOSIS — M9901 Segmental and somatic dysfunction of cervical region: Secondary | ICD-10-CM | POA: Diagnosis not present

## 2019-08-05 DIAGNOSIS — M9902 Segmental and somatic dysfunction of thoracic region: Secondary | ICD-10-CM | POA: Diagnosis not present

## 2019-08-19 DIAGNOSIS — M4003 Postural kyphosis, cervicothoracic region: Secondary | ICD-10-CM | POA: Diagnosis not present

## 2019-08-19 DIAGNOSIS — M50322 Other cervical disc degeneration at C5-C6 level: Secondary | ICD-10-CM | POA: Diagnosis not present

## 2019-08-19 DIAGNOSIS — M9902 Segmental and somatic dysfunction of thoracic region: Secondary | ICD-10-CM | POA: Diagnosis not present

## 2019-08-19 DIAGNOSIS — M9901 Segmental and somatic dysfunction of cervical region: Secondary | ICD-10-CM | POA: Diagnosis not present

## 2019-09-08 DIAGNOSIS — M4003 Postural kyphosis, cervicothoracic region: Secondary | ICD-10-CM | POA: Diagnosis not present

## 2019-09-08 DIAGNOSIS — M9901 Segmental and somatic dysfunction of cervical region: Secondary | ICD-10-CM | POA: Diagnosis not present

## 2019-09-08 DIAGNOSIS — M9902 Segmental and somatic dysfunction of thoracic region: Secondary | ICD-10-CM | POA: Diagnosis not present

## 2019-09-08 DIAGNOSIS — M50322 Other cervical disc degeneration at C5-C6 level: Secondary | ICD-10-CM | POA: Diagnosis not present

## 2019-10-05 DIAGNOSIS — L718 Other rosacea: Secondary | ICD-10-CM | POA: Diagnosis not present

## 2019-11-16 DIAGNOSIS — L718 Other rosacea: Secondary | ICD-10-CM | POA: Diagnosis not present

## 2019-12-20 DIAGNOSIS — M75121 Complete rotator cuff tear or rupture of right shoulder, not specified as traumatic: Secondary | ICD-10-CM | POA: Diagnosis not present

## 2020-01-05 ENCOUNTER — Encounter: Payer: Self-pay | Admitting: Sports Medicine

## 2020-01-05 ENCOUNTER — Ambulatory Visit: Payer: Federal, State, Local not specified - PPO | Admitting: Sports Medicine

## 2020-01-05 ENCOUNTER — Ambulatory Visit (INDEPENDENT_AMBULATORY_CARE_PROVIDER_SITE_OTHER): Payer: Federal, State, Local not specified - PPO

## 2020-01-05 ENCOUNTER — Other Ambulatory Visit: Payer: Self-pay | Admitting: Sports Medicine

## 2020-01-05 ENCOUNTER — Other Ambulatory Visit: Payer: Self-pay

## 2020-01-05 DIAGNOSIS — M778 Other enthesopathies, not elsewhere classified: Secondary | ICD-10-CM

## 2020-01-05 DIAGNOSIS — M79671 Pain in right foot: Secondary | ICD-10-CM | POA: Diagnosis not present

## 2020-01-05 DIAGNOSIS — M205X1 Other deformities of toe(s) (acquired), right foot: Secondary | ICD-10-CM

## 2020-01-05 DIAGNOSIS — M21619 Bunion of unspecified foot: Secondary | ICD-10-CM

## 2020-01-05 DIAGNOSIS — M79672 Pain in left foot: Secondary | ICD-10-CM | POA: Diagnosis not present

## 2020-01-05 DIAGNOSIS — M779 Enthesopathy, unspecified: Secondary | ICD-10-CM

## 2020-01-05 MED ORDER — PREDNISONE 10 MG (21) PO TBPK: ORAL_TABLET | ORAL | 0 refills | Status: DC

## 2020-01-05 NOTE — Progress Notes (Signed)
Subjective: Allison Pacheco is a 59 y.o. female patient who presents to office for evaluation of bilateral foot pain.  Patient reports that the last couple of months there has been pain that comes and goes worse with shoes at her right first toe however today and over the last couple of weeks it has not been bothering her at all patient reports worst pain over the last 2 weeks at the lateral foot and arch reports that the pain is worse at the lateral foot denies any trauma or injury but there has been swelling at times and pain that flares up 10 out of 10 reports that icing and elevating helps and reports a history of plantar fasciitis over 10 years ago.  Patient denies any other pedal complaints at this time.  Patient Active Problem List   Diagnosis Date Noted  . Coronary artery disease involving native coronary artery of native heart without angina pectoris 12/21/2015  . Dyslipidemia 12/21/2015  . Gastroesophageal reflux disease without esophagitis 12/21/2015  . Hypertriglyceridemia 12/21/2015    Current Outpatient Medications on File Prior to Visit  Medication Sig Dispense Refill  . amoxicillin-clavulanate (AUGMENTIN) 875-125 MG tablet Take 1 tablet by mouth 2 (two) times daily. 28 tablet 0  . aspirin EC 81 MG tablet Take 1 tablet (81 mg total) by mouth daily. 90 tablet 3  . simvastatin (ZOCOR) 80 MG tablet Take 80 mg by mouth daily.     No current facility-administered medications on file prior to visit.    Allergies  Allergen Reactions  . Codeine     Objective:  General: Alert and oriented x3 in no acute distress  Dermatology: No open lesions bilateral lower extremities, no webspace macerations, no ecchymosis bilateral, all nails x 10 are well manicured.  Vascular: Dorsalis Pedis and Posterior Tibial pedal pulses palpable, Capillary Fill Time 3 seconds,(+) pedal hair growth bilateral, trace edema bilateral lower extremities, Temperature gradient within normal limits.  Neurology:  Michaell Cowing sensation intact via light touch bilateral.  Musculoskeletal: No tenderness with palpation at first metatarsophalangeal joint with limited range of motion on right.  There is pain palpation minimal to the medial arch on the left but worsening pain with palpation along the peroneal tendon course on the left.  Gait: Antalgic gait  Xrays  Right and left foot   Impression: Joint space narrowing at first metatarsophalangeal joint with very minimal early bunion formation noted on the right, there is inferior calcaneal heel spur noted bilateral left greater than right, there is no significant fracture or obvious dislocation.  Mild soft tissue swelling.  Assessment and Plan: Problem List Items Addressed This Visit    None    Visit Diagnoses    Right foot pain    -  Primary   Relevant Orders   DG Foot Complete Left   Hallux limitus of right foot       Bunion       Arch pain, left       Tendinitis       Left foot pain           -Complete examination performed -Xrays reviewed -Discussed treatement options for bilateral foot pain -Rx prednisone Dosepak for patient to take as instructed -Dispense ankle gauntlet for patient to use on the left to offer some support to her arch and to take stress and strain off her lateral tendons to strap from medial to lateral as instructed and to wear during the day to help with pain on the left foot -  Advised patient to continue with rest ice elevation and may do gentle stretching but must avoid strenuous activity or inversion and eversion to prevent worsening of peroneal/lateral foot symptoms -Return to office in 2 to 3 weeks for follow-up evaluation  Asencion Islam, DPM

## 2020-01-06 DIAGNOSIS — N3091 Cystitis, unspecified with hematuria: Secondary | ICD-10-CM | POA: Diagnosis not present

## 2020-01-06 DIAGNOSIS — N3001 Acute cystitis with hematuria: Secondary | ICD-10-CM | POA: Diagnosis not present

## 2020-01-12 ENCOUNTER — Other Ambulatory Visit: Payer: Self-pay | Admitting: Sports Medicine

## 2020-01-12 DIAGNOSIS — Z1152 Encounter for screening for COVID-19: Secondary | ICD-10-CM | POA: Diagnosis not present

## 2020-01-12 DIAGNOSIS — J324 Chronic pansinusitis: Secondary | ICD-10-CM | POA: Diagnosis not present

## 2020-01-12 DIAGNOSIS — M21619 Bunion of unspecified foot: Secondary | ICD-10-CM

## 2020-01-12 DIAGNOSIS — M779 Enthesopathy, unspecified: Secondary | ICD-10-CM

## 2020-01-12 DIAGNOSIS — Z20828 Contact with and (suspected) exposure to other viral communicable diseases: Secondary | ICD-10-CM | POA: Diagnosis not present

## 2020-01-26 ENCOUNTER — Ambulatory Visit: Payer: Federal, State, Local not specified - PPO | Admitting: Sports Medicine

## 2020-01-26 ENCOUNTER — Encounter: Payer: Self-pay | Admitting: Sports Medicine

## 2020-01-26 ENCOUNTER — Other Ambulatory Visit: Payer: Self-pay

## 2020-01-26 DIAGNOSIS — M79672 Pain in left foot: Secondary | ICD-10-CM

## 2020-01-26 DIAGNOSIS — M779 Enthesopathy, unspecified: Secondary | ICD-10-CM

## 2020-01-26 NOTE — Progress Notes (Signed)
Subjective: Allison Pacheco is a 59 y.o. female patient who presents to office for evaluation of bilateral foot pain.  Patient reports that she has pain on left, right is fine. Patient reports that she went to [place] and had increased pain, reports that she finished the steroid but still has pain.  Patient denies any other pedal complaints at this time.  Patient Active Problem List   Diagnosis Date Noted  . Coronary artery disease involving native coronary artery of native heart without angina pectoris 12/21/2015  . Dyslipidemia 12/21/2015  . Gastroesophageal reflux disease without esophagitis 12/21/2015  . Hypertriglyceridemia 12/21/2015    Current Outpatient Medications on File Prior to Visit  Medication Sig Dispense Refill  . amoxicillin-clavulanate (AUGMENTIN) 875-125 MG tablet Take 1 tablet by mouth 2 (two) times daily. 28 tablet 0  . aspirin EC 81 MG tablet Take 1 tablet (81 mg total) by mouth daily. 90 tablet 3  . predniSONE (STERAPRED UNI-PAK 21 TAB) 10 MG (21) TBPK tablet Take as directed 21 tablet 0  . simvastatin (ZOCOR) 80 MG tablet Take 80 mg by mouth daily.     No current facility-administered medications on file prior to visit.    Allergies  Allergen Reactions  . Codeine     Objective:  General: Alert and oriented x3 in no acute distress  Dermatology: No open lesions bilateral lower extremities, no webspace macerations, no ecchymosis bilateral, all nails x 10 are well manicured.  Vascular: Dorsalis Pedis and Posterior Tibial pedal pulses palpable, Capillary Fill Time 3 seconds,(+) pedal hair growth bilateral, trace edema bilateral lower extremities, Temperature gradient within normal limits.  Neurology: Johney Maine sensation intact via light touch bilateral.  Musculoskeletal: There is pain at the peroneal tendon course on the left worse at 5th met base.  Gait: Antalgic gait on left  Assessment and Plan: Problem List Items Addressed This Visit    None    Visit  Diagnoses    Left foot pain    -  Primary   Tendinitis           -Complete examination performed -Xrays reviewed -Discussed treatment options for tendonitis with possible acute on chronic tear -Rx CAM boot -Recommend rest, ice, and elevation and topical OTC voltaren and OTC NSAIDs  -Return to office in 2 to 3 weeks for follow-up evaluation  Landis Martins, DPM

## 2020-02-09 ENCOUNTER — Encounter: Payer: Self-pay | Admitting: Sports Medicine

## 2020-02-09 ENCOUNTER — Ambulatory Visit: Payer: Federal, State, Local not specified - PPO | Admitting: Sports Medicine

## 2020-02-09 ENCOUNTER — Other Ambulatory Visit: Payer: Self-pay

## 2020-02-09 DIAGNOSIS — M779 Enthesopathy, unspecified: Secondary | ICD-10-CM

## 2020-02-09 DIAGNOSIS — M79672 Pain in left foot: Secondary | ICD-10-CM

## 2020-02-09 DIAGNOSIS — S86312A Strain of muscle(s) and tendon(s) of peroneal muscle group at lower leg level, left leg, initial encounter: Secondary | ICD-10-CM | POA: Diagnosis not present

## 2020-02-09 DIAGNOSIS — S93402S Sprain of unspecified ligament of left ankle, sequela: Secondary | ICD-10-CM

## 2020-02-09 NOTE — Progress Notes (Signed)
Subjective: Allison Pacheco is a 59 y.o. female patient who presents to office for follow-up evaluation of bilateral foot pain.  Patient reports that pain remains on the left foot states that she cannot walk without her cam boot due to increase in pain reports that there has been a small amount of swelling and that the compression sleeve ice and elevation helps but is still concerned because she cannot walk without the boot due to the pain on the lateral side of the foot and heel.  Patient denies any reinjury patient has been struggling with pain now for approximately 6 weeks or more without any relief with past treatments including steroid medication self PT and above. Patient Active Problem List   Diagnosis Date Noted  . Coronary artery disease involving native coronary artery of native heart without angina pectoris 12/21/2015  . Dyslipidemia 12/21/2015  . Gastroesophageal reflux disease without esophagitis 12/21/2015  . Hypertriglyceridemia 12/21/2015    Current Outpatient Medications on File Prior to Visit  Medication Sig Dispense Refill  . amoxicillin-clavulanate (AUGMENTIN) 875-125 MG tablet Take 1 tablet by mouth 2 (two) times daily. 28 tablet 0  . aspirin EC 81 MG tablet Take 1 tablet (81 mg total) by mouth daily. 90 tablet 3  . predniSONE (STERAPRED UNI-PAK 21 TAB) 10 MG (21) TBPK tablet Take as directed 21 tablet 0  . simvastatin (ZOCOR) 80 MG tablet Take 80 mg by mouth daily.     No current facility-administered medications on file prior to visit.    Allergies  Allergen Reactions  . Codeine     Objective:  General: Alert and oriented x3 in no acute distress  Dermatology: No open lesions bilateral lower extremities, no webspace macerations, no ecchymosis bilateral, all nails x 10 are well manicured.  Vascular: Dorsalis Pedis and Posterior Tibial pedal pulses palpable, Capillary Fill Time 3 seconds,(+) pedal hair growth bilateral, trace edema bilateral lower extremities,  Temperature gradient within normal limits.  Neurology: Johney Maine sensation intact via light touch bilateral.  Musculoskeletal: There is pain at the peroneal tendon course on the left worse at 5th met base and at the lateral ankle ligaments, negative anterior drawer, pain worsened with plantarflexion and inversion of left foot.  Gait: Cam boot assisted antalgic gait on left  Assessment and Plan: Problem List Items Addressed This Visit    None    Visit Diagnoses    Tear of peroneal tendon of left foot    -  Primary   Relevant Orders   MR ANKLE LEFT WO CONTRAST   Sprain of left ankle, unspecified ligament, sequela       Tendinitis       Left foot pain           -Complete examination performed -Discussed treatment options for possible acute on chronic tear versus unresolved left ankle sprain -Ordered MRI for further evaluation to rule out tear especially at peroneal tendon and lateral ankle ligaments since patient has not made any improvements with conservative care and x-rays have been negative -Continue with cam boot protection when attempting to ambulate -Recommend rest, ice, and elevation and topical OTC voltaren and OTC NSAIDs as tolerated to help with pain -Continue with compression sleeve for edema control -Return to office after MRI for follow-up evaluation  Landis Martins, DPM

## 2020-02-11 ENCOUNTER — Telehealth: Payer: Self-pay

## 2020-02-11 NOTE — Telephone Encounter (Signed)
Faxed to [facility] authorization order for MRI of Lt ankle w/o contrast to rule out tear of peroneal tendon and lateral ankle ligaments. I faxed to [facility] patient's demographics, insurance  Card, detailed office note, ordering Dr's ( N[place], Tax Id, address, phone numbner, Fax numbner), current signs and symptoms of exam, any diagnostic studies/ptherapy and meds.

## 2020-02-11 NOTE — Telephone Encounter (Signed)
-----   Message from Asencion Islam, [place] sent at 02/09/2020  2:11 PM EDT ----- Regarding: MRI L ankle MRI order placed for [facility] imaging [place]

## 2020-02-23 ENCOUNTER — Ambulatory Visit
Admission: RE | Admit: 2020-02-23 | Discharge: 2020-02-23 | Disposition: A | Discharge status: Home / Self Care | Payer: Federal, State, Local not specified - PPO | Source: Ambulatory Visit | Attending: Sports Medicine | Admitting: Sports Medicine

## 2020-02-23 ENCOUNTER — Other Ambulatory Visit: Payer: Self-pay

## 2020-02-23 DIAGNOSIS — S86312A Strain of muscle(s) and tendon(s) of peroneal muscle group at lower leg level, left leg, initial encounter: Secondary | ICD-10-CM

## 2020-02-23 DIAGNOSIS — S92355A Nondisplaced fracture of fifth metatarsal bone, left foot, initial encounter for closed fracture: Secondary | ICD-10-CM | POA: Diagnosis not present

## 2020-02-23 DIAGNOSIS — R600 Localized edema: Secondary | ICD-10-CM | POA: Diagnosis not present

## 2020-02-23 DIAGNOSIS — M19072 Primary osteoarthritis, left ankle and foot: Secondary | ICD-10-CM | POA: Diagnosis not present

## 2020-02-29 ENCOUNTER — Telehealth: Payer: Self-pay | Admitting: Podiatrist

## 2020-02-29 NOTE — Telephone Encounter (Signed)
Thanks Yes will you let her know that there's a fracture of 5th metatarsal base and inflammation of her peroneal tendon. For now she should continue with using her CAM boot, rest, ice, elevation, and ace wrap if needed. Also make sure she gets a follow up appointment for her to see me to discuss further treatment options Thanks Dr. Marylene Land

## 2020-02-29 NOTE — Telephone Encounter (Signed)
Thanks so much! And no worries :-)

## 2020-02-29 NOTE — Telephone Encounter (Signed)
Hi!  This patient needs a follow up appointment with Dr. Cannon Kettle-  (re: foot healing from fifth met fracture peroneal tenosyovitis seen on MRI)  Could one of you call and get her on Dr. Delia Chimes schedule? (in 34 or so days probably good)  I spoke with her today and told her the results of the MRI and to wear her cam boot, ice, and elevate the foot.    Thanks!

## 2020-02-29 NOTE — Telephone Encounter (Signed)
I phoned her back and explained MRI and to wear boot and your recommendation.  Sent note to Lurena Joiner and Morrie Sheldon in [place] to call patient to schedule her a follow up in 10 or so days.  (sorry if repeat info from me- I'm still working on my epic skills :)

## 2020-02-29 NOTE — Telephone Encounter (Signed)
Patient called asking about her MRI results.    Had it done wed sept 1st-  She would like to know the results.  Let me know if you would like me to call her (looks like a fracture of fifth met base and peroneal tenosynovitis)  Thanks!

## 2020-03-01 NOTE — Telephone Encounter (Signed)
Called Pt and scheduled an appt on 9/14 with Dr. Marylene Land.

## 2020-03-07 ENCOUNTER — Ambulatory Visit: Payer: Federal, State, Local not specified - PPO | Admitting: Sports Medicine

## 2020-03-14 ENCOUNTER — Encounter: Payer: Self-pay | Admitting: Sports Medicine

## 2020-03-14 ENCOUNTER — Ambulatory Visit: Payer: Federal, State, Local not specified - PPO | Admitting: Sports Medicine

## 2020-03-14 ENCOUNTER — Other Ambulatory Visit: Payer: Self-pay

## 2020-03-14 DIAGNOSIS — M779 Enthesopathy, unspecified: Secondary | ICD-10-CM | POA: Diagnosis not present

## 2020-03-14 DIAGNOSIS — M79672 Pain in left foot: Secondary | ICD-10-CM | POA: Diagnosis not present

## 2020-03-14 DIAGNOSIS — S92352D Displaced fracture of fifth metatarsal bone, left foot, subsequent encounter for fracture with routine healing: Secondary | ICD-10-CM

## 2020-03-14 NOTE — Progress Notes (Signed)
Subjective: Allison Pacheco is a 58 y.o. female patient who presents to office for follow-up evaluation of left foot pain.  Patient reports that she is doing good and that the cam boot helps very little swelling states that she has been doing the best that she can to be consistent with her boot rest ice elevate and reports that since she has been working from home that the pain is much better.  Patient denies any other pedal complaints at this time. Patient Active Problem List   Diagnosis Date Noted  . Coronary artery disease involving native coronary artery of native heart without angina pectoris 12/21/2015  . Dyslipidemia 12/21/2015  . Gastroesophageal reflux disease without esophagitis 12/21/2015  . Hypertriglyceridemia 12/21/2015    Current Outpatient Medications on File Prior to Visit  Medication Sig Dispense Refill  . amoxicillin-clavulanate (AUGMENTIN) 875-125 MG tablet Take 1 tablet by mouth 2 (two) times daily. 28 tablet 0  . aspirin EC 81 MG tablet Take 1 tablet (81 mg total) by mouth daily. 90 tablet 3  . predniSONE (STERAPRED UNI-PAK 21 TAB) 10 MG (21) TBPK tablet Take as directed 21 tablet 0  . simvastatin (ZOCOR) 80 MG tablet Take 80 mg by mouth daily.     No current facility-administered medications on file prior to visit.    Allergies  Allergen Reactions  . Codeine     Objective:  General: Alert and oriented x3 in no acute distress  Dermatology: No open lesions bilateral lower extremities, no webspace macerations, no ecchymosis bilateral, all nails x 10 are well manicured.  Vascular: Dorsalis Pedis and Posterior Tibial pedal pulses palpable, Capillary Fill Time 3 seconds,(+) pedal hair growth bilateral, trace edema bilateral lower extremities, Temperature gradient within normal limits.  Neurology: Gross sensation intact via light touch bilateral.  Musculoskeletal: There is pain at the peroneal tendon course on the left worse at 5th met base and at the lateral ankle  ligaments, that appears to be improving in nature as compared to last visit, negative anterior drawer, pain worsened with plantarflexion and inversion of left foot.  Gait: Cam boot assisted minimally antalgic gait on left  Assessment and Plan: Problem List Items Addressed This Visit    None    Visit Diagnoses    Tendinitis    -  Primary   Closed fracture of base of fifth metatarsal bone with routine healing, left       Left foot pain           -Complete examination performed -Discussed treatment options for tendonitis with healing fracture of the 5th on lwdr -MRI results reviewed again with patient -Advised her to continue with CAM boot for 1 more mont -Recommend rest, ice, and elevation and topical OTC voltaren and OTC NSAIDs as tolerated to help with pain like before -Continue with compression sleeve for edema control like previous -Work note provided to allow virtual/at home work until next office visit -Return to office in 1 month for follow-up evaluation  Titorya Stover, DPM  

## 2020-04-11 ENCOUNTER — Other Ambulatory Visit: Payer: Self-pay

## 2020-04-11 ENCOUNTER — Encounter: Payer: Self-pay | Admitting: Sports Medicine

## 2020-04-11 ENCOUNTER — Ambulatory Visit: Payer: Federal, State, Local not specified - PPO | Admitting: Sports Medicine

## 2020-04-11 DIAGNOSIS — S92352D Displaced fracture of fifth metatarsal bone, left foot, subsequent encounter for fracture with routine healing: Secondary | ICD-10-CM | POA: Diagnosis not present

## 2020-04-11 DIAGNOSIS — M779 Enthesopathy, unspecified: Secondary | ICD-10-CM

## 2020-04-11 DIAGNOSIS — M79672 Pain in left foot: Secondary | ICD-10-CM

## 2020-04-11 NOTE — Progress Notes (Signed)
Subjective: Allison Pacheco is a 59 y.o. female patient who presents to office for follow-up evaluation of left foot pain.  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  Reports and admits that she did take a few steps outside of her boot and she felt okay but her ankle felt weak.  Patient denies any other pedal complaints at this time.  Patient Active Problem List   Diagnosis Date Noted  . Coronary artery disease involving native coronary artery of native heart without angina pectoris 12/21/2015  . Dyslipidemia 12/21/2015  . Gastroesophageal reflux disease without esophagitis 12/21/2015  . Hypertriglyceridemia 12/21/2015    Current Outpatient Medications on File Prior to Visit  Medication Sig Dispense Refill  . amoxicillin-clavulanate (AUGMENTIN) 875-125 MG tablet Take 1 tablet by mouth 2 (two) times daily. 28 tablet 0  . aspirin EC 81 MG tablet Take 1 tablet (81 mg total) by mouth daily. 90 tablet 3  . predniSONE (STERAPRED UNI-PAK 21 TAB) 10 MG (21) TBPK tablet Take as directed 21 tablet 0  . simvastatin (ZOCOR) 80 MG tablet Take 80 mg by mouth daily.     No current facility-administered medications on file prior to visit.    Allergies  Allergen Reactions  . Codeine     Objective:  General: Alert and oriented x3 in no acute distress  Dermatology: No open lesions bilateral lower extremities, no webspace macerations, no ecchymosis bilateral, all nails x 10 are well manicured.  Vascular: Dorsalis Pedis and Posterior Tibial pedal pulses palpable, Capillary Fill Time 3 seconds,(+) pedal hair growth bilateral, trace edema bilateral lower extremities, Temperature gradient within normal limits.  Neurology: Johney Maine sensation intact via light touch bilateral.  Musculoskeletal: There is very minimal pain at the  peroneal tendon course on the left and at 5th met base and at the lateral ankle ligaments, that appears to be improving in nature as compared to last visit, negative anterior drawer, no increase in pain with range of motion or plantarflexion or inversion of left foot.  Gait: Cam boot assisted minimally antalgic gait on left  Assessment and Plan: Problem List Items Addressed This Visit    None    Visit Diagnoses    Tendinitis    -  Primary   Closed fracture of base of fifth metatarsal bone with routine healing, left       Left foot pain           -Complete examination performed -Discussed treatment options for tendonitis with healing fracture of the 5th on left -Dispensed trilock support brace for the left, patient may wean from boot to brace as tolerated -Continue with rest ice elevation and advised patient if she decides to start wearing the brace and it feels comfortable to use tennis shoe -Work note provided to continue to allow virtual/at home work until next office visit due to foot pain and healing from fracture -Return to office in 4-5 weeks for follow-up evaluation and repeat x-ray to evaluate the healing at the fifth metatarsal fracture.  Landis Martins, DPM

## 2020-04-25 ENCOUNTER — Encounter: Payer: Self-pay | Admitting: Cardiology

## 2020-04-25 ENCOUNTER — Ambulatory Visit: Payer: Federal, State, Local not specified - PPO | Admitting: Cardiology

## 2020-04-25 ENCOUNTER — Other Ambulatory Visit: Payer: Self-pay

## 2020-04-25 VITALS — BP 100/62 | HR 79 | Ht 64.0 in | Wt 215.0 lb

## 2020-04-25 DIAGNOSIS — E785 Hyperlipidemia, unspecified: Secondary | ICD-10-CM

## 2020-04-25 DIAGNOSIS — E781 Pure hyperglyceridemia: Secondary | ICD-10-CM | POA: Diagnosis not present

## 2020-04-25 DIAGNOSIS — I2510 Atherosclerotic heart disease of native coronary artery without angina pectoris: Secondary | ICD-10-CM

## 2020-04-25 LAB — LIPID PANEL
Chol/HDL Ratio: 4.7 ratio — ABNORMAL HIGH (ref 0.0–4.4)
Cholesterol, Total: 173 mg/dL (ref 100–199)
HDL: 37 mg/dL — ABNORMAL LOW (ref >39)
LDL Chol Calc (NIH): 100 mg/dL — ABNORMAL HIGH (ref 0–99)
Triglycerides: 209 mg/dL — ABNORMAL HIGH (ref 0–149)
VLDL Cholesterol Cal: 36 mg/dL (ref 5–40)

## 2020-04-25 NOTE — Addendum Note (Signed)
Addended by: Hazle Quant on: 04/25/2020 11:07 AM   Modules accepted: Orders

## 2020-04-25 NOTE — Addendum Note (Signed)
Addended by: Hazle Quant on: 04/25/2020 11:11 AM   Modules accepted: Orders

## 2020-04-25 NOTE — Progress Notes (Signed)
Cardiology Office Note:    Date:  04/25/2020   ID:  Allison Pacheco, DOB 09-Jun-1961, MRN [id number]  PCP:  Patient, No Pcp Per  Cardiologist:  Gypsy Balsam, MD    Referring MD: No ref. provider found   Chief Complaint  Patient presents with  . Annual Exam  I am doing well  History of Present Illness:    Allison Pacheco is a 59 y.o. female with past medical history significant for coronary artery disease.  In 1997 she had cardiac catheterization which revealed completely occluded distal left anterior descending artery.  Since that time she seems to be doing well.  Denies have any chest pain tightness squeezing pressure burning chest.  I did see her last time in 2019.  She used to exercise on the regular basis she was able to run on the treadmill up to 6 mph however in the summer she was in [place] and she slipped in the pool and broke her left ankle.  Since that time she is gradually getting better but not to the point of running on the treadmill.  There is no swelling of lower extremities there is no proximal fibular dyspnea there is no palpitations no dizziness  Past Medical History:  Diagnosis Date  . Coronary artery disease   . GERD (gastroesophageal reflux disease)   . Hyperlipidemia     Past Surgical History:  Procedure Laterality Date  . ABDOMINAL HYSTERECTOMY    . CHOLECYSTECTOMY    . SHOULDER SURGERY Right 2019   Rotator cuff repair  . TONSILLECTOMY      Current Medications: Current Meds  Medication Sig  . aspirin 325 MG tablet Take 650 mg by mouth at bedtime.  Marland Kitchen doxycycline (ADOXA) 100 MG tablet Take 100 mg by mouth daily. For Rosecea     Allergies:   Codeine   Social History   Socioeconomic History  . Marital status: Unknown    Spouse name: Not on file  . Number of children: Not on file  . Years of education: Not on file  . Highest education level: Not on file  Occupational History  . Not on file  Tobacco Use  . Smoking status: Never Smoker  .  Smokeless tobacco: Never Used  Vaping Use  . Vaping Use: Never used  Substance and Sexual Activity  . Alcohol use: No    Alcohol/week: 0.0 standard drinks  . Drug use: No  . Sexual activity: Not on file  Other Topics Concern  . Not on file  Social History Narrative  . Not on file   Social Determinants of Health   Financial Resource Strain:   . Difficulty of Paying Living Expenses: Not on file  Food Insecurity:   . Worried About Programme researcher, broadcasting/film/video in the Last Year: Not on file  . Ran Out of Food in the Last Year: Not on file  Transportation Needs:   . Lack of Transportation (Medical): Not on file  . Lack of Transportation (Non-Medical): Not on file  Physical Activity:   . Days of Exercise per Week: Not on file  . Minutes of Exercise per Session: Not on file  Stress:   . Feeling of Stress : Not on file  Social Connections:   . Frequency of Communication with Friends and Family: Not on file  . Frequency of Social Gatherings with Friends and Family: Not on file  . Attends Religious Services: Not on file  . Active Member of Clubs or Organizations: Not on file  .  Attends Banker Meetings: Not on file  . Marital Status: Not on file     Family History: The patient's family history includes Cancer in her father. ROS:   Please see the history of present illness.    All 14 point review of systems negative except as described per history of present illness  EKGs/Labs/Other Studies Reviewed:      Recent Labs: No results found for requested labs within last 8760 hours.  Recent Lipid Panel    Component Value Date/Time   CHOL 181 01/27/2018 1625   TRIG 321 (H) 01/27/2018 1625   HDL 36 (L) 01/27/2018 1625   CHOLHDL 5.0 (H) 01/27/2018 1625   LDLCALC 81 01/27/2018 1625    Physical Exam:    VS:  BP 100/62 (BP Location: Right Arm, Patient Position: Sitting, Cuff Size: Large)   Pulse 79   Ht 5\' 4"  (1.626 m)   Wt 215 lb (97.5 kg)   SpO2 96%   BMI 36.90 kg/m      Wt Readings from Last 3 Encounters:  04/25/20 215 lb (97.5 kg)  01/27/18 194 lb (88 kg)  01/29/17 179 lb 12.8 oz (81.6 kg)     GEN:  Well nourished, well developed in no acute distress HEENT: Normal NECK: No JVD; No carotid bruits LYMPHATICS: No lymphadenopathy CARDIAC: RRR, no murmurs, no rubs, no gallops RESPIRATORY:  Clear to auscultation without rales, wheezing or rhonchi  ABDOMEN: Soft, non-tender, non-distended MUSCULOSKELETAL:  No edema; No deformity  SKIN: Warm and dry LOWER EXTREMITIES: no swelling NEUROLOGIC:  Alert and oriented x 3 PSYCHIATRIC:  Normal affect   ASSESSMENT:    1. Coronary artery disease involving native coronary artery of native heart without angina pectoris   2. Hypertriglyceridemia   3. Dyslipidemia    PLAN:    In order of problems listed above:  1. Coronary artery disease status post cardiac catheterization 1997 showing occluded distal LAD.  Plan for now will be continue present management.  She takes higher dose of aspirin because of right shoulder pain not because of cardiac reasons.  I told her that from heart point of view 81 mg daily is enough.  I will ask you to have an echocardiogram done to reassess left ventricle ejection fraction. 2. Dyslipidemia: She is taking simvastatin 80 which I will continue.  I will ask you to have fasting lipid profile done. 3. Hypertriglyceridemia again cholesterol be checked today. We did talk about healthy lifestyle which she is already doing.  I recommended her to be active.   Medication Adjustments/Labs and Tests Ordered: Current medicines are reviewed at length with the patient today.  Concerns regarding medicines are outlined above.  No orders of the defined types were placed in this encounter.  Medication changes: No orders of the defined types were placed in this encounter.   Signed, 1998, MD, [facility] 04/25/2020 10:48 AM    [facility]

## 2020-04-25 NOTE — Patient Instructions (Signed)
Medication Instructions:  Your physician recommends that you continue on your current medications as directed. Please refer to the Current Medication list given to you today.  *If you need a refill on your cardiac medications before your next appointment, please call your pharmacy*   Lab Work: Your physician recommends that you return for lab work today: lipid   If you have labs (blood work) drawn today and your tests are completely normal, you will receive your results only by: . MyChart Message (if you have MyChart) OR . A paper copy in the mail If you have any lab test that is abnormal or we need to change your treatment, we will call you to review the results.   Testing/Procedures: Your physician has requested that you have an echocardiogram. Echocardiography is a painless test that uses sound waves to create images of your heart. It provides your doctor with information about the size and shape of your heart and how well your heart's chambers and valves are working. This procedure takes approximately one hour. There are no restrictions for this procedure.     Follow-Up: At [facility], you and your health needs are our priority.  As part of our continuing mission to provide you with exceptional heart care, we have created designated Provider Care Teams.  These Care Teams include your primary Cardiologist (physician) and Advanced Practice Providers (APPs -  Physician Assistants and Nurse Practitioners) who all work together to provide you with the care you need, when you need it.  We recommend signing up for the patient portal called "MyChart".  Sign up information is provided on this After Visit Summary.  MyChart is used to connect with patients for Virtual Visits (Telemedicine).  Patients are able to view lab/test results, encounter notes, upcoming appointments, etc.  Non-urgent messages can be sent to your provider as well.   To learn more about what you can do with MyChart, go to  [email or web address].    Your next appointment:   6 month(s)  The format for your next appointment:   In Person  Provider:   Robert Krasowski, MD   Other Instructions   Echocardiogram An echocardiogram is a procedure that uses painless sound waves (ultrasound) to produce an image of the heart. Images from an echocardiogram can provide important information about:  Signs of coronary artery disease (CAD).  Aneurysm detection. An aneurysm is a weak or damaged part of an artery wall that bulges out from the normal force of blood pumping through the body.  Heart size and shape. Changes in the size or shape of the heart can be associated with certain conditions, including heart failure, aneurysm, and CAD.  Heart muscle function.  Heart valve function.  Signs of a past heart attack.  Fluid buildup around the heart.  Thickening of the heart muscle.  A tumor or infectious growth around the heart valves. Tell a health care provider about:  Any allergies you have.  All medicines you are taking, including vitamins, herbs, eye drops, creams, and over-the-counter medicines.  Any blood disorders you have.  Any surgeries you have had.  Any medical conditions you have.  Whether you are pregnant or may be pregnant. What are the risks? Generally, this is a safe procedure. However, problems may occur, including:  Allergic reaction to dye (contrast) that may be used during the procedure. What happens before the procedure? No specific preparation is needed. You may eat and drink normally. What happens during the procedure?   An IV tube   may be inserted into one of your veins.  You may receive contrast through this tube. A contrast is an injection that improves the quality of the pictures from your heart.  A gel will be applied to your chest.  A wand-like tool (transducer) will be moved over your chest. The gel will help to transmit the sound waves from the  transducer.  The sound waves will harmlessly bounce off of your heart to allow the heart images to be captured in real-time motion. The images will be recorded on a computer. The procedure may vary among health care providers and hospitals. What happens after the procedure?  You may return to your normal, everyday life, including diet, activities, and medicines, unless your health care provider tells you not to do that. Summary  An echocardiogram is a procedure that uses painless sound waves (ultrasound) to produce an image of the heart.  Images from an echocardiogram can provide important information about the size and shape of your heart, heart muscle function, heart valve function, and fluid buildup around your heart.  You do not need to do anything to prepare before this procedure. You may eat and drink normally.  After the echocardiogram is completed, you may return to your normal, everyday life, unless your health care provider tells you not to do that. This information is not intended to replace advice given to you by your health care provider. Make sure you discuss any questions you have with your health care provider. Document Revised: 10/01/2018 Document Reviewed: 07/13/2016 Elsevier Patient Education  2020 Elsevier Inc.   

## 2020-04-27 ENCOUNTER — Telehealth: Payer: Self-pay | Admitting: Emergency Medicine

## 2020-04-27 MED ORDER — ATORVASTATIN CALCIUM 10 MG PO TABS: 10.0000 mg | ORAL_TABLET | Freq: Every day | ORAL | 1 refills | Status: DC

## 2020-04-27 NOTE — Telephone Encounter (Signed)
Called patient informed her of results and recommendations please see additional encounter.

## 2020-04-27 NOTE — Telephone Encounter (Signed)
Follow Up:     Pt is returning your call. 

## 2020-04-27 NOTE — Telephone Encounter (Signed)
Called patient informed her of results and recommendations to start lipitor 10 mg daily. She verbally understood. No further questions.

## 2020-04-27 NOTE — Telephone Encounter (Signed)
-----   Message from Georgeanna Lea, MD sent at 04/26/2020 12:57 PM EDT ----- Her cholesterol is unacceptably high, she need to be taking statin.  I suggest Lipitor 10 mg daily ask if she is willing to do it

## 2020-05-10 ENCOUNTER — Other Ambulatory Visit: Payer: Self-pay

## 2020-05-10 ENCOUNTER — Ambulatory Visit (INDEPENDENT_AMBULATORY_CARE_PROVIDER_SITE_OTHER): Payer: Federal, State, Local not specified - PPO

## 2020-05-10 ENCOUNTER — Encounter: Payer: Self-pay | Admitting: Sports Medicine

## 2020-05-10 ENCOUNTER — Ambulatory Visit: Payer: Federal, State, Local not specified - PPO | Admitting: Sports Medicine

## 2020-05-10 DIAGNOSIS — S92352D Displaced fracture of fifth metatarsal bone, left foot, subsequent encounter for fracture with routine healing: Secondary | ICD-10-CM

## 2020-05-10 DIAGNOSIS — M19011 Primary osteoarthritis, right shoulder: Secondary | ICD-10-CM | POA: Diagnosis not present

## 2020-05-10 DIAGNOSIS — M779 Enthesopathy, unspecified: Secondary | ICD-10-CM

## 2020-05-10 DIAGNOSIS — M79672 Pain in left foot: Secondary | ICD-10-CM

## 2020-05-10 DIAGNOSIS — M7541 Impingement syndrome of right shoulder: Secondary | ICD-10-CM | POA: Diagnosis not present

## 2020-05-10 DIAGNOSIS — M75121 Complete rotator cuff tear or rupture of right shoulder, not specified as traumatic: Secondary | ICD-10-CM | POA: Diagnosis not present

## 2020-05-10 NOTE — Progress Notes (Signed)
Subjective: Allison Pacheco is a 59 y.o. female patient who presents to office for follow-up evaluation of left foot pain.  Patient reports that she is doing better brace helps, swelling is better and occasional sharp pains.  Patient denies any other pedal complaints at this time.  Patient Active Problem List   Diagnosis Date Noted  . Coronary artery disease involving native coronary artery of native heart without angina pectoris 12/21/2015  . Dyslipidemia 12/21/2015  . Gastroesophageal reflux disease without esophagitis 12/21/2015  . Hypertriglyceridemia 12/21/2015    Current Outpatient Medications on File Prior to Visit  Medication Sig Dispense Refill  . aspirin 325 MG tablet Take 650 mg by mouth at bedtime.    Marland Kitchen atorvastatin (LIPITOR) 10 MG tablet Take 1 tablet (10 mg total) by mouth daily. 90 tablet 1  . doxycycline (ADOXA) 100 MG tablet Take 100 mg by mouth daily. For Rosecea     No current facility-administered medications on file prior to visit.    Allergies  Allergen Reactions  . Codeine     Objective:  General: Alert and oriented x3 in no acute distress  Dermatology: No open lesions bilateral lower extremities, no webspace macerations, no ecchymosis bilateral, all nails x 10 are well manicured.  Vascular: Dorsalis Pedis and Posterior Tibial pedal pulses palpable, Capillary Fill Time 3 seconds,(+) pedal hair growth bilateral, trace edema bilateral lower extremities, Temperature gradient within normal limits.  Neurology: Johney Maine sensation intact via light touch bilateral.  Musculoskeletal: There is very minimal pain at the peroneal tendon course on the left and at 5th met base and at the lateral ankle ligaments, that appears to be continuing to slowly improving in nature as compared to last visit, negative anterior drawer, no increase in pain with range of motion or plantarflexion or inversion of left foot.  Gait: Trilock assisted   Assessment and Plan: Problem List Items  Addressed This Visit    None    Visit Diagnoses    Closed fracture of base of fifth metatarsal bone with routine healing, left    -  Primary   Relevant Orders   DG Foot Complete Left (Completed)   Tendinitis       Left foot pain          -Complete examination performed -Xrays 5th metatarsal fracture appears to be healing well, 70% healed -Re-Discussed treatment options for tendonitis with healing fracture of the 5th on left -Continue with trilock support brace for the left -Continue with rest ice elevation -Work note: Continue with virtual work  -Return to office in 4-5 weeks for follow-up evaluation and repeat x-ray to evaluate the healing at the fifth metatarsal fracture.  Landis Martins, DPM

## 2020-05-11 ENCOUNTER — Ambulatory Visit (INDEPENDENT_AMBULATORY_CARE_PROVIDER_SITE_OTHER): Payer: Federal, State, Local not specified - PPO

## 2020-05-11 DIAGNOSIS — I2510 Atherosclerotic heart disease of native coronary artery without angina pectoris: Secondary | ICD-10-CM | POA: Diagnosis not present

## 2020-05-11 LAB — ECHOCARDIOGRAM COMPLETE
Area-P 1/2: 3.12 cm2
S' Lateral: 3.4 cm

## 2020-05-11 NOTE — Progress Notes (Signed)
Complete echocardiogram performed.  Jimmy Nile Dorning RDCS, RVT  

## 2020-05-24 DIAGNOSIS — M25511 Pain in right shoulder: Secondary | ICD-10-CM | POA: Diagnosis not present

## 2020-05-31 DIAGNOSIS — M75121 Complete rotator cuff tear or rupture of right shoulder, not specified as traumatic: Secondary | ICD-10-CM | POA: Diagnosis not present

## 2020-05-31 DIAGNOSIS — M7541 Impingement syndrome of right shoulder: Secondary | ICD-10-CM | POA: Diagnosis not present

## 2020-05-31 DIAGNOSIS — M19011 Primary osteoarthritis, right shoulder: Secondary | ICD-10-CM | POA: Diagnosis not present

## 2020-06-07 ENCOUNTER — Ambulatory Visit (INDEPENDENT_AMBULATORY_CARE_PROVIDER_SITE_OTHER): Payer: Federal, State, Local not specified - PPO

## 2020-06-07 ENCOUNTER — Encounter: Payer: Self-pay | Admitting: Sports Medicine

## 2020-06-07 ENCOUNTER — Other Ambulatory Visit: Payer: Self-pay

## 2020-06-07 ENCOUNTER — Ambulatory Visit: Payer: Federal, State, Local not specified - PPO | Admitting: Sports Medicine

## 2020-06-07 DIAGNOSIS — M79672 Pain in left foot: Secondary | ICD-10-CM

## 2020-06-07 DIAGNOSIS — M779 Enthesopathy, unspecified: Secondary | ICD-10-CM | POA: Diagnosis not present

## 2020-06-07 DIAGNOSIS — S92352D Displaced fracture of fifth metatarsal bone, left foot, subsequent encounter for fracture with routine healing: Secondary | ICD-10-CM

## 2020-06-07 NOTE — Progress Notes (Signed)
Subjective: Allison Pacheco is a 59 y.o. female patient who presents to office for follow-up evaluation of left foot pain.  Patient reports that she is doing better brace helps, like before with no major pain.  Patient denies any other pedal complaints at this time.  Patient Active Problem List   Diagnosis Date Noted  . Coronary artery disease involving native coronary artery of native heart without angina pectoris 12/21/2015  . Dyslipidemia 12/21/2015  . Gastroesophageal reflux disease without esophagitis 12/21/2015  . Hypertriglyceridemia 12/21/2015    Current Outpatient Medications on File Prior to Visit  Medication Sig Dispense Refill  . aspirin 325 MG tablet Take 650 mg by mouth at bedtime.    Marland Kitchen atorvastatin (LIPITOR) 10 MG tablet Take 1 tablet (10 mg total) by mouth daily. 90 tablet 1  . doxycycline (ADOXA) 100 MG tablet Take 100 mg by mouth daily. For Rosecea     No current facility-administered medications on file prior to visit.    Allergies  Allergen Reactions  . Codeine     Objective:  General: Alert and oriented x3 in no acute distress  Dermatology: No open lesions bilateral lower extremities, no webspace macerations, no ecchymosis bilateral, all nails x 10 are well manicured.  Vascular: Dorsalis Pedis and Posterior Tibial pedal pulses palpable, Capillary Fill Time 3 seconds,(+) pedal hair growth bilateral, trace edema bilateral lower extremities, Temperature gradient within normal limits.  Neurology: Johney Maine sensation intact via light touch bilateral.  Musculoskeletal: There is no reproducible pain at the peroneal tendon course or on the left 5th met base and at the lateral ankle ligaments, symptoms have much improved, negative anterior drawer, no increase in pain with range of motion or plantarflexion or inversion of left foot.  Gait: Trilock assisted like before  Assessment and Plan: Problem List Items Addressed This Visit   None   Visit Diagnoses    Closed  fracture of base of fifth metatarsal bone with routine healing, left    -  Primary   Relevant Orders   DG Foot Complete Left   Tendinitis       Left foot pain          -Complete examination performed -Xrays 5th metatarsal fracture appears to be healing well, 90 % healed -Re-Discussed treatment options for tendonitis with almost fracture of the 5th on left -Continue with trilock support brace for the left for 1 month and slowly may wean -Continue with good supportive shoes -Continue with rest ice elevation -Work note: Continue with virtual work for 1 month -Return to office if symptoms fail to continue to improve.  Landis Martins, DPM

## 2020-08-10 DIAGNOSIS — R051 Acute cough: Secondary | ICD-10-CM | POA: Diagnosis not present

## 2020-08-10 DIAGNOSIS — R0981 Nasal congestion: Secondary | ICD-10-CM | POA: Diagnosis not present

## 2020-08-10 DIAGNOSIS — H6990 Unspecified Eustachian tube disorder, unspecified ear: Secondary | ICD-10-CM | POA: Diagnosis not present

## 2020-08-10 DIAGNOSIS — J209 Acute bronchitis, unspecified: Secondary | ICD-10-CM | POA: Diagnosis not present

## 2020-08-21 DIAGNOSIS — L718 Other rosacea: Secondary | ICD-10-CM | POA: Diagnosis not present

## 2020-10-30 ENCOUNTER — Other Ambulatory Visit: Payer: Self-pay

## 2020-10-30 DIAGNOSIS — E785 Hyperlipidemia, unspecified: Secondary | ICD-10-CM | POA: Insufficient documentation

## 2020-10-30 DIAGNOSIS — I2510 Atherosclerotic heart disease of native coronary artery without angina pectoris: Secondary | ICD-10-CM | POA: Insufficient documentation

## 2020-10-30 DIAGNOSIS — K219 Gastro-esophageal reflux disease without esophagitis: Secondary | ICD-10-CM | POA: Insufficient documentation

## 2020-11-03 ENCOUNTER — Ambulatory Visit: Payer: Federal, State, Local not specified - PPO | Admitting: Cardiology

## 2020-11-03 ENCOUNTER — Other Ambulatory Visit: Payer: Self-pay

## 2020-11-03 ENCOUNTER — Encounter: Payer: Self-pay | Admitting: Cardiology

## 2020-11-03 VITALS — BP 126/70 | HR 72 | Ht 64.0 in | Wt 222.0 lb

## 2020-11-03 DIAGNOSIS — E782 Mixed hyperlipidemia: Secondary | ICD-10-CM | POA: Diagnosis not present

## 2020-11-03 DIAGNOSIS — E785 Hyperlipidemia, unspecified: Secondary | ICD-10-CM | POA: Diagnosis not present

## 2020-11-03 DIAGNOSIS — K219 Gastro-esophageal reflux disease without esophagitis: Secondary | ICD-10-CM

## 2020-11-03 DIAGNOSIS — I2510 Atherosclerotic heart disease of native coronary artery without angina pectoris: Secondary | ICD-10-CM | POA: Diagnosis not present

## 2020-11-03 DIAGNOSIS — E781 Pure hyperglyceridemia: Secondary | ICD-10-CM

## 2020-11-03 NOTE — Progress Notes (Signed)
Cardiology Office Note:    Date:  11/03/2020   ID:  Allison Pacheco, DOB [birth year], MRN [id number]  PCP:  Patient, No Pcp Per (Inactive)  Cardiologist:  Gypsy Balsam, MD    Referring MD: No ref. provider found   Chief Complaint  Patient presents with  . Follow-up  Am doing fine  History of Present Illness:    Allison Pacheco is a 60 y.o. female with past medical history significant for coronary artery disease.  In 1997 she had cardiac catheterization done which showed completely occluded distal LAD.  Since that time she has been stable without any coronary events.  She does have dyslipidemia as well as hypertriglyceridemia.  Overall she is doing well trying to be active recently sustained some fracture on her leg she was injured [place] she slipped in the pool and ended up having problem with metatarsal bone that was broken her left side.  Recovering from it started walking back on the regular basis.  Past Medical History:  Diagnosis Date  . Coronary artery disease   . Coronary artery disease involving native coronary artery of native heart without angina pectoris 12/21/2015   Overview:  Completely occluded distal left anterior descending artery 1997  . Dyslipidemia 12/21/2015  . Gastroesophageal reflux disease without esophagitis 12/21/2015  . GERD (gastroesophageal reflux disease)   . Hyperlipidemia   . Hypertriglyceridemia 12/21/2015    Past Surgical History:  Procedure Laterality Date  . ABDOMINAL HYSTERECTOMY    . CHOLECYSTECTOMY    . SHOULDER SURGERY Right 2019   Rotator cuff repair  . TONSILLECTOMY      Current Medications: Current Meds  Medication Sig  . aspirin 325 MG tablet Take 650 mg by mouth at bedtime.  Marland Kitchen atorvastatin (LIPITOR) 10 MG tablet Take 1 tablet (10 mg total) by mouth daily.  Marland Kitchen doxycycline (ADOXA) 100 MG tablet Take 100 mg by mouth daily. For Rosecea  . doxycycline (VIBRAMYCIN) 100 MG capsule Take 100 mg by mouth 2 (two) times daily.  . metroNIDAZOLE  (METROCREAM) 0.75 % cream Apply 1 application topically every morning.  . [DISCONTINUED] metoprolol succinate (TOPROL-XL) 50 MG 24 hr tablet Take 1 tablet by mouth daily.     Allergies:   Codeine   Social History   Socioeconomic History  . Marital status: Unknown    Spouse name: Not on file  . Number of children: Not on file  . Years of education: Not on file  . Highest education level: Not on file  Occupational History  . Not on file  Tobacco Use  . Smoking status: Never Smoker  . Smokeless tobacco: Never Used  Vaping Use  . Vaping Use: Never used  Substance and Sexual Activity  . Alcohol use: No    Alcohol/week: 0.0 standard drinks  . Drug use: No  . Sexual activity: Not on file  Other Topics Concern  . Not on file  Social History Narrative  . Not on file   Social Determinants of Health   Financial Resource Strain: Not on file  Food Insecurity: Not on file  Transportation Needs: Not on file  Physical Activity: Not on file  Stress: Not on file  Social Connections: Not on file     Family History: The patient's family history includes Cancer in her father. ROS:   Please see the history of present illness.    All 14 point review of systems negative except as described per history of present illness  EKGs/Labs/Other Studies Reviewed:  Recent Labs: No results found for requested labs within last 8760 hours.  Recent Lipid Panel    Component Value Date/Time   CHOL 173 04/25/2020 1111   TRIG 209 (H) 04/25/2020 1111   HDL 37 (L) 04/25/2020 1111   CHOLHDL 4.7 (H) 04/25/2020 1111   LDLCALC 100 (H) 04/25/2020 1111    Physical Exam:    VS:  BP 126/70 (BP Location: Left Arm, Patient Position: Sitting)   Pulse 72   Ht 5\' 4"  (1.626 m)   Wt 222 lb (100.7 kg)   SpO2 96%   BMI 38.11 kg/m     Wt Readings from Last 3 Encounters:  11/03/20 222 lb (100.7 kg)  04/25/20 215 lb (97.5 kg)  01/27/18 194 lb (88 kg)     GEN:  Well nourished, well developed in no  acute distress HEENT: Normal NECK: No JVD; No carotid bruits LYMPHATICS: No lymphadenopathy CARDIAC: RRR, no murmurs, no rubs, no gallops RESPIRATORY:  Clear to auscultation without rales, wheezing or rhonchi  ABDOMEN: Soft, non-tender, non-distended MUSCULOSKELETAL:  No edema; No deformity  SKIN: Warm and dry LOWER EXTREMITIES: no swelling NEUROLOGIC:  Alert and oriented x 3 PSYCHIATRIC:  Normal affect   ASSESSMENT:    1. Hypertriglyceridemia   2. Dyslipidemia   3. Coronary artery disease involving native coronary artery of native heart without angina pectoris   4. Mixed hyperlipidemia   5. Gastroesophageal reflux disease without esophagitis    PLAN:    In order of problems listed above:  1. Coronary disease completely occluded distal LAD from cardiac cath in 1997, completely asymptomatic decreased risk factors modifications.  She takes aspirin 325 I told her again this is too much she needed takes 81 mg tablets every single day for cardioprotective effect. 2. Dyslipidemia we will check her fasting lipid profile today.  I did review K PN however latest data I have is from 11-21 with HDL of 37 LDL 100.  Goal will be less than 70 of LDL.  Will get another test. 3. Hypertriglyceridemia again will wait for results of fasting lipid profile. 4. Gastroesophageal reflux disease I told her it is dangerous for her to continue high dose of aspirin and recommended again to lower the dose.   Medication Adjustments/Labs and Tests Ordered: Current medicines are reviewed at length with the patient today.  Concerns regarding medicines are outlined above.  Orders Placed This Encounter  Procedures  . Lipid panel   Medication changes: No orders of the defined types were placed in this encounter.   Signed, 12-29[birth year]1970, MD, [facility] 11/03/2020 12:01 PM    [facility]

## 2020-11-03 NOTE — Patient Instructions (Signed)
Medication Instructions:  Your physician recommends that you continue on your current medications as directed. Please refer to the Current Medication list given to you today.  *If you need a refill on your cardiac medications before your next appointment, please call your pharmacy*   Lab Work: Your physician recommends that you return for lab work in: Lipid Profile   If you have labs (blood work) drawn today and your tests are completely normal, you will receive your results only by: . MyChart Message (if you have MyChart) OR . A paper copy in the mail If you have any lab test that is abnormal or we need to change your treatment, we will call you to review the results.   Testing/Procedures: None   Follow-Up: At [facility], you and your health needs are our priority.  As part of our continuing mission to provide you with exceptional heart care, we have created designated Provider Care Teams.  These Care Teams include your primary Cardiologist (physician) and Advanced Practice Providers (APPs -  Physician Assistants and Nurse Practitioners) who all work together to provide you with the care you need, when you need it.  We recommend signing up for the patient portal called "MyChart".  Sign up information is provided on this After Visit Summary.  MyChart is used to connect with patients for Virtual Visits (Telemedicine).  Patients are able to view lab/test results, encounter notes, upcoming appointments, etc.  Non-urgent messages can be sent to your provider as well.   To learn more about what you can do with MyChart, go to [email or web address].    Your next appointment:   6 month(s)  The format for your next appointment:   In Person  Provider:   Robert Krasowski, MD   Other Instructions   

## 2020-11-04 LAB — LIPID PANEL
Chol/HDL Ratio: 3.4 ratio (ref 0.0–4.4)
Cholesterol, Total: 138 mg/dL (ref 100–199)
HDL: 41 mg/dL (ref >39)
LDL Chol Calc (NIH): 66 mg/dL (ref 0–99)
Triglycerides: 186 mg/dL — ABNORMAL HIGH (ref 0–149)
VLDL Cholesterol Cal: 31 mg/dL (ref 5–40)

## 2020-11-08 ENCOUNTER — Telehealth: Payer: Self-pay

## 2020-11-08 NOTE — Telephone Encounter (Signed)
Patient notified of test results 

## 2020-11-08 NOTE — Telephone Encounter (Signed)
-----   Message from Georgeanna Lea, MD sent at 11/08/2020  8:23 AM EDT ----- Chol looks good, cont same

## 2020-11-27 ENCOUNTER — Other Ambulatory Visit: Payer: Self-pay | Admitting: Cardiology

## 2020-11-27 NOTE — Telephone Encounter (Signed)
Rx approved and sent 

## 2021-02-15 DIAGNOSIS — Z20828 Contact with and (suspected) exposure to other viral communicable diseases: Secondary | ICD-10-CM | POA: Diagnosis not present

## 2021-05-07 DIAGNOSIS — D485 Neoplasm of uncertain behavior of skin: Secondary | ICD-10-CM | POA: Diagnosis not present

## 2021-05-07 DIAGNOSIS — L718 Other rosacea: Secondary | ICD-10-CM | POA: Diagnosis not present

## 2021-06-06 ENCOUNTER — Encounter: Payer: Self-pay | Admitting: Cardiology

## 2021-06-06 ENCOUNTER — Ambulatory Visit (INDEPENDENT_AMBULATORY_CARE_PROVIDER_SITE_OTHER): Payer: Federal, State, Local not specified - PPO | Admitting: Cardiology

## 2021-06-06 ENCOUNTER — Other Ambulatory Visit: Payer: Self-pay

## 2021-06-06 VITALS — BP 126/80 | HR 84 | Ht 64.0 in | Wt 225.8 lb

## 2021-06-06 DIAGNOSIS — I2510 Atherosclerotic heart disease of native coronary artery without angina pectoris: Secondary | ICD-10-CM

## 2021-06-06 DIAGNOSIS — E782 Mixed hyperlipidemia: Secondary | ICD-10-CM | POA: Diagnosis not present

## 2021-06-06 DIAGNOSIS — K219 Gastro-esophageal reflux disease without esophagitis: Secondary | ICD-10-CM

## 2021-06-06 DIAGNOSIS — E785 Hyperlipidemia, unspecified: Secondary | ICD-10-CM

## 2021-06-06 NOTE — Addendum Note (Signed)
Addended by: Hazle Quant on: 06/06/2021 03:47 PM   Modules accepted: Orders

## 2021-06-06 NOTE — Progress Notes (Signed)
Cardiology Office Note:    Date:  06/06/2021   ID:  Allison Pacheco, DOB 01-Dec-1960, MRN [id number]  PCP:  Patient, No Pcp Per (Inactive)  Cardiologist:  Gypsy Balsam, MD    Referring MD: No ref. provider found   Chief Complaint  Patient presents with   Follow-up  Doing very well  History of Present Illness:    Allison Pacheco is a 60 y.o. female  female with past medical history significant for coronary artery disease.  In 1997 she had cardiac catheterization done which showed completely occluded distal LAD.  Since that time she has been stable without any coronary events.  She does have dyslipidemia as well as hypertriglyceridemia.   She is seen in my office today for regular follow-up.  Overall she is doing very well.  She denies have any chest pain tightness squeezing pressure burning chest no palpitations no dizziness.  She still goes to gym on the regular basis she likes to swim and she is doing it a lot.  Denies have any cardiac complaints  Past Medical History:  Diagnosis Date   Coronary artery disease    Coronary artery disease involving native coronary artery of native heart without angina pectoris 12/21/2015   Overview:  Completely occluded distal left anterior descending artery 1997   Dyslipidemia 12/21/2015   Gastroesophageal reflux disease without esophagitis 12/21/2015   GERD (gastroesophageal reflux disease)    Hyperlipidemia    Hypertriglyceridemia 12/21/2015    Past Surgical History:  Procedure Laterality Date   ABDOMINAL HYSTERECTOMY     CHOLECYSTECTOMY     SHOULDER SURGERY Right 2019   Rotator cuff repair   TONSILLECTOMY      Current Medications: Current Meds  Medication Sig   atorvastatin (LIPITOR) 10 MG tablet TAKE 1 TABLET BY MOUTH DAILY (Patient taking differently: Take 10 mg by mouth daily.)   doxycycline (VIBRAMYCIN) 100 MG capsule Take 100 mg by mouth 2 (two) times daily.   metroNIDAZOLE (METROCREAM) 0.75 % cream Apply 1 application topically  every morning.     Allergies:   Codeine   Social History   Socioeconomic History   Marital status: Unknown    Spouse name: Not on file   Number of children: Not on file   Years of education: Not on file   Highest education level: Not on file  Occupational History   Not on file  Tobacco Use   Smoking status: Never   Smokeless tobacco: Never  Vaping Use   Vaping Use: Never used  Substance and Sexual Activity   Alcohol use: No    Alcohol/week: 0.0 standard drinks   Drug use: No   Sexual activity: Not on file  Other Topics Concern   Not on file  Social History Narrative   Not on file   Social Determinants of Health   Financial Resource Strain: Not on file  Food Insecurity: Not on file  Transportation Needs: Not on file  Physical Activity: Not on file  Stress: Not on file  Social Connections: Not on file     Family History: The patient's family history includes Cancer in her father. ROS:   Please see the history of present illness.    All 14 point review of systems negative except as described per history of present illness  EKGs/Labs/Other Studies Reviewed:      Recent Labs: No results found for requested labs within last 8760 hours.  Recent Lipid Panel    Component Value Date/Time   CHOL 138 11/03/2020 1205  TRIG 186 (H) 11/03/2020 1205   HDL 41 11/03/2020 1205   CHOLHDL 3.4 11/03/2020 1205   LDLCALC 66 11/03/2020 1205    Physical Exam:    VS:  BP 126/80 (BP Location: Right Arm, Patient Position: Sitting)    Pulse 84    Ht 5\' 4"  (1.626 m)    Wt 225 lb 12.8 oz (102.4 kg)    SpO2 97%    BMI 38.76 kg/m     Wt Readings from Last 3 Encounters:  06/06/21 225 lb 12.8 oz (102.4 kg)  11/03/20 222 lb (100.7 kg)  04/25/20 215 lb (97.5 kg)     GEN:  Well nourished, well developed in no acute distress HEENT: Normal NECK: No JVD; No carotid bruits LYMPHATICS: No lymphadenopathy CARDIAC: RRR, no murmurs, no rubs, no gallops RESPIRATORY:  Clear to  auscultation without rales, wheezing or rhonchi  ABDOMEN: Soft, non-tender, non-distended MUSCULOSKELETAL:  No edema; No deformity  SKIN: Warm and dry LOWER EXTREMITIES: no swelling NEUROLOGIC:  Alert and oriented x 3 PSYCHIATRIC:  Normal affect   ASSESSMENT:    1. Coronary artery disease involving native coronary artery of native heart without angina pectoris   2. Gastroesophageal reflux disease without esophagitis   3. Dyslipidemia   4. Mixed hyperlipidemia    PLAN:    In order of problems listed above:  Coronary disease stable from that point review continue present management which include antiplatelets therapy Gastroesophageal reflux disease denies having any complaints from that aspect recently. Dyslipidemia she is on Lipitor 10 with latest fasting lipid profile from 10 PN I with LDL of 66 HDL 41 good cholesterol profile continue present management. I did review last echocardiogram which showed mild to moderate mitral regurgitation that concerns me somewhat even though she does not have any typical symptoms I will schedule her to have another echocardiogram done.   Medication Adjustments/Labs and Tests Ordered: Current medicines are reviewed at length with the patient today.  Concerns regarding medicines are outlined above.  No orders of the defined types were placed in this encounter.  Medication changes: No orders of the defined types were placed in this encounter.   Signed, 13/02/21, MD, [facility] 06/06/2021 3:42 PM    [facility]

## 2021-06-06 NOTE — Patient Instructions (Signed)
Medication Instructions:  Your physician recommends that you continue on your current medications as directed. Please refer to the Current Medication list given to you today.  *If you need a refill on your cardiac medications before your next appointment, please call your pharmacy*   Lab Work: None If you have labs (blood work) drawn today and your tests are completely normal, you will receive your results only by: MyChart Message (if you have MyChart) OR A paper copy in the mail If you have any lab test that is abnormal or we need to change your treatment, we will call you to review the results.   Testing/Procedures: Your physician has requested that you have an echocardiogram. Echocardiography is a painless test that uses sound waves to create images of your heart. It provides your doctor with information about the size and shape of your heart and how well your heart's chambers and valves are working. This procedure takes approximately one hour. There are no restrictions for this procedure.    Follow-Up: At [facility], you and your health needs are our priority.  As part of our continuing mission to provide you with exceptional heart care, we have created designated Provider Care Teams.  These Care Teams include your primary Cardiologist (physician) and Advanced Practice Providers (APPs -  Physician Assistants and Nurse Practitioners) who all work together to provide you with the care you need, when you need it.  We recommend signing up for the patient portal called "MyChart".  Sign up information is provided on this After Visit Summary.  MyChart is used to connect with patients for Virtual Visits (Telemedicine).  Patients are able to view lab/test results, encounter notes, upcoming appointments, etc.  Non-urgent messages can be sent to your provider as well.   To learn more about what you can do with MyChart, go to [email or web address].    Your next appointment:   1 year(s)  The  format for your next appointment:   In Person  Provider:   Robert Krasowski, MD    Other Instructions  Echocardiogram An echocardiogram is a test that uses sound waves (ultrasound) to produce images of the heart. Images from an echocardiogram can provide important information about: Heart size and shape. The size and thickness and movement of your heart's walls. Heart muscle function and strength. Heart valve function or if you have stenosis. Stenosis is when the heart valves are too narrow. If blood is flowing backward through the heart valves (regurgitation). A tumor or infectious growth around the heart valves. Areas of heart muscle that are not working well because of poor blood flow or injury from a heart attack. Aneurysm detection. An aneurysm is a weak or damaged part of an artery wall. The wall bulges out from the normal force of blood pumping through the body. Tell a health care provider about: Any allergies you have. All medicines you are taking, including vitamins, herbs, eye drops, creams, and over-the-counter medicines. Any blood disorders you have. Any surgeries you have had. Any medical conditions you have. Whether you are pregnant or may be pregnant. What are the risks? Generally, this is a safe test. However, problems may occur, including an allergic reaction to dye (contrast) that may be used during the test. What happens before the test? No specific preparation is needed. You may eat and drink normally. What happens during the test?  You will take off your clothes from the waist up and put on a hospital gown. Electrodes or electrocardiogram (ECG)patches may be   placed on your chest. The electrodes or patches are then connected to a device that monitors your heart rate and rhythm. You will lie down on a table for an ultrasound exam. A gel will be applied to your chest to help sound waves pass through your skin. A handheld device, called a transducer, will be  pressed against your chest and moved over your heart. The transducer produces sound waves that travel to your heart and bounce back (or "echo" back) to the transducer. These sound waves will be captured in real-time and changed into images of your heart that can be viewed on a video monitor. The images will be recorded on a computer and reviewed by your health care provider. You may be asked to change positions or hold your breath for a short time. This makes it easier to get different views or better views of your heart. In some cases, you may receive contrast through an IV in one of your veins. This can improve the quality of the pictures from your heart. The procedure may vary among health care providers and hospitals. What can I expect after the test? You may return to your normal, everyday life, including diet, activities, and medicines, unless your health care provider tells you not to do that. Follow these instructions at home: It is up to you to get the results of your test. Ask your health care provider, or the department that is doing the test, when your results will be ready. Keep all follow-up visits. This is important. Summary An echocardiogram is a test that uses sound waves (ultrasound) to produce images of the heart. Images from an echocardiogram can provide important information about the size and shape of your heart, heart muscle function, heart valve function, and other possible heart problems. You do not need to do anything to prepare before this test. You may eat and drink normally. After the echocardiogram is completed, you may return to your normal, everyday life, unless your health care provider tells you not to do that. This information is not intended to replace advice given to you by your health care provider. Make sure you discuss any questions you have with your health care provider. Document Revised: 02/21/2021 Document Reviewed: 02/01/2020 Elsevier Patient Education   2022 Elsevier Inc.   

## 2021-06-11 DIAGNOSIS — J101 Influenza due to other identified influenza virus with other respiratory manifestations: Secondary | ICD-10-CM | POA: Diagnosis not present

## 2021-06-22 ENCOUNTER — Ambulatory Visit (INDEPENDENT_AMBULATORY_CARE_PROVIDER_SITE_OTHER): Payer: Federal, State, Local not specified - PPO

## 2021-06-22 ENCOUNTER — Other Ambulatory Visit: Payer: Self-pay

## 2021-06-22 DIAGNOSIS — I2510 Atherosclerotic heart disease of native coronary artery without angina pectoris: Secondary | ICD-10-CM | POA: Diagnosis not present

## 2021-06-22 DIAGNOSIS — E782 Mixed hyperlipidemia: Secondary | ICD-10-CM | POA: Diagnosis not present

## 2021-06-22 DIAGNOSIS — K219 Gastro-esophageal reflux disease without esophagitis: Secondary | ICD-10-CM

## 2021-06-22 DIAGNOSIS — E785 Hyperlipidemia, unspecified: Secondary | ICD-10-CM

## 2021-06-22 LAB — ECHOCARDIOGRAM COMPLETE
Area-P 1/2: 3.24 cm2
S' Lateral: 3.2 cm

## 2021-06-25 DIAGNOSIS — R051 Acute cough: Secondary | ICD-10-CM | POA: Diagnosis not present

## 2021-06-25 DIAGNOSIS — J209 Acute bronchitis, unspecified: Secondary | ICD-10-CM | POA: Diagnosis not present

## 2021-07-03 DIAGNOSIS — L989 Disorder of the skin and subcutaneous tissue, unspecified: Secondary | ICD-10-CM | POA: Diagnosis not present

## 2021-07-03 DIAGNOSIS — D485 Neoplasm of uncertain behavior of skin: Secondary | ICD-10-CM | POA: Diagnosis not present

## 2021-07-03 DIAGNOSIS — R2241 Localized swelling, mass and lump, right lower limb: Secondary | ICD-10-CM | POA: Diagnosis not present

## 2021-07-04 DIAGNOSIS — L718 Other rosacea: Secondary | ICD-10-CM | POA: Diagnosis not present

## 2021-07-04 DIAGNOSIS — L821 Other seborrheic keratosis: Secondary | ICD-10-CM | POA: Diagnosis not present

## 2021-07-04 DIAGNOSIS — D225 Melanocytic nevi of trunk: Secondary | ICD-10-CM | POA: Diagnosis not present

## 2021-07-04 DIAGNOSIS — L814 Other melanin hyperpigmentation: Secondary | ICD-10-CM | POA: Diagnosis not present

## 2021-07-17 DIAGNOSIS — M1711 Unilateral primary osteoarthritis, right knee: Secondary | ICD-10-CM | POA: Diagnosis not present

## 2021-07-23 ENCOUNTER — Other Ambulatory Visit: Payer: Self-pay | Admitting: Cardiology

## 2021-07-25 DIAGNOSIS — R051 Acute cough: Secondary | ICD-10-CM | POA: Diagnosis not present

## 2021-07-25 DIAGNOSIS — J111 Influenza due to unidentified influenza virus with other respiratory manifestations: Secondary | ICD-10-CM | POA: Diagnosis not present

## 2021-07-27 DIAGNOSIS — M1711 Unilateral primary osteoarthritis, right knee: Secondary | ICD-10-CM | POA: Diagnosis not present

## 2021-07-27 DIAGNOSIS — M25461 Effusion, right knee: Secondary | ICD-10-CM | POA: Diagnosis not present

## 2021-07-27 DIAGNOSIS — D1723 Benign lipomatous neoplasm of skin and subcutaneous tissue of right leg: Secondary | ICD-10-CM | POA: Diagnosis not present

## 2021-07-27 DIAGNOSIS — D1779 Benign lipomatous neoplasm of other sites: Secondary | ICD-10-CM | POA: Diagnosis not present

## 2021-07-31 ENCOUNTER — Telehealth: Payer: Self-pay

## 2021-07-31 DIAGNOSIS — M1711 Unilateral primary osteoarthritis, right knee: Secondary | ICD-10-CM | POA: Diagnosis not present

## 2021-07-31 DIAGNOSIS — Z01818 Encounter for other preprocedural examination: Secondary | ICD-10-CM

## 2021-07-31 DIAGNOSIS — I2510 Atherosclerotic heart disease of native coronary artery without angina pectoris: Secondary | ICD-10-CM

## 2021-07-31 NOTE — Telephone Encounter (Signed)
° °  Pre-operative Risk Assessment    Patient Name: Allison Pacheco  DOB: 08-Jan-1961 MRN: [id number]      Request for Surgical Clearance    Procedure:   right knee popliteal lipoma excision  Date of Surgery:  Clearance TBD                                 Surgeon:  Dr. Garnette Gunner Surgeon's Group or Practice Name:  [facility] Phone number:  [phone] Fax number:  [phone] Attention Jasmine December   Type of Clearance Requested:   - Medical    Type of Anesthesia:  General    Additional requests/questions:    SignedDione Housekeeper   07/31/2021, 9:48 AM

## 2021-07-31 NOTE — Telephone Encounter (Signed)
° °  Name: Allison Pacheco  DOB: August 28, 1960  MRN: [id number]   Primary Cardiologist: Gypsy Balsam, MD  Chart reviewed as part of pre-operative protocol coverage. Patient was contacted 07/31/2021 in reference to pre-operative risk assessment for pending surgery as outlined below.  Allison Pacheco was last seen 05/2021 by Dr. Bing Matter. Per his note, " In 1997 she had cardiac catheterization done which showed completely occluded distal LAD.  Since that time she has been stable without any coronary events." 2D echo 06/22/21 done to f/u MR showed EF 60-65%, grade 1 DD, no evidence of MR.   RCRI is preliminarily calculated at 0.9% but this assumes renal function is normal. We do not have any labs on file besides lipids and remote LFTs. In speaking with the patient she does not have a primary care provider so she states she has not had any bloodwork elsewhere. She also states she is not getting any sort of pre-op labs before the day of surgery. As part of her clearance I think it is prudent to obtain a baseline CMET and CBC as these have not been checked in our system -> will route to callback team to assist. (Dx: Preop cardiovascular exam, CAD.) Surgery is scheduled 08/13/21 per patient so I also updated visit info to reflect the date.  I reached out to patient for update on how she is doing. The patient affirms she has been doing well without any new cardiac symptoms.  She is able to exert over 4 METS [facility], walking, and housework, so as long as labs are stable, she would be at acceptable risk for the planned procedure without further cardiovascular testing. Once labs are back we can finalize clearance. The patient was advised that if she develops new symptoms prior to surgery to contact our office to arrange for a follow-up visit, and she verbalized understanding. I did encourage her to establish with a PCP.  She confirms she is taking a baby ASA so I added this to her medicine  list. We are not asked to hold at this time so anticipate our recommendation will be to continue if able.  Laurann Montana, PA-C 07/31/2021, 2:23 PM

## 2021-08-01 NOTE — Telephone Encounter (Signed)
I s/w the pt and she is agreeable to plan of care for lab work for pre op clearance, CMET, CBC. Pt aware she does not need a lab appt for the [place] office, she can go in at her convenience. Pt said she will go sometime tomorrow. I will place the lab orders.

## 2021-08-01 NOTE — Telephone Encounter (Signed)
I will send a message to Dr. Vanetta Shawl office to set pt up for lab appt for pre op clearance.

## 2021-08-02 ENCOUNTER — Other Ambulatory Visit: Payer: Self-pay

## 2021-08-02 DIAGNOSIS — I2510 Atherosclerotic heart disease of native coronary artery without angina pectoris: Secondary | ICD-10-CM | POA: Diagnosis not present

## 2021-08-02 DIAGNOSIS — Z01818 Encounter for other preprocedural examination: Secondary | ICD-10-CM | POA: Diagnosis not present

## 2021-08-03 ENCOUNTER — Telehealth: Payer: Self-pay

## 2021-08-03 DIAGNOSIS — Z713 Dietary counseling and surveillance: Secondary | ICD-10-CM | POA: Diagnosis not present

## 2021-08-03 DIAGNOSIS — E785 Hyperlipidemia, unspecified: Secondary | ICD-10-CM | POA: Diagnosis not present

## 2021-08-03 DIAGNOSIS — I2510 Atherosclerotic heart disease of native coronary artery without angina pectoris: Secondary | ICD-10-CM | POA: Diagnosis not present

## 2021-08-03 DIAGNOSIS — E639 Nutritional deficiency, unspecified: Secondary | ICD-10-CM | POA: Diagnosis not present

## 2021-08-03 LAB — COMPREHENSIVE METABOLIC PANEL WITH GFR
ALT: 34 IU/L — ABNORMAL HIGH (ref 0–32)
AST: 26 IU/L (ref 0–40)
Albumin/Globulin Ratio: 1.6 (ref 1.2–2.2)
Albumin: 4.1 g/dL (ref 3.8–4.8)
Alkaline Phosphatase: 87 IU/L (ref 44–121)
BUN/Creatinine Ratio: 20 (ref 12–28)
BUN: 13 mg/dL (ref 8–27)
Bilirubin Total: 0.4 mg/dL (ref 0.0–1.2)
CO2: 22 mmol/L (ref 20–29)
Calcium: 9.4 mg/dL (ref 8.7–10.3)
Chloride: 105 mmol/L (ref 96–106)
Creatinine, Ser: 0.65 mg/dL (ref 0.57–1.00)
Globulin, Total: 2.5 g/dL (ref 1.5–4.5)
Glucose: 101 mg/dL — ABNORMAL HIGH (ref 70–99)
Potassium: 4.8 mmol/L (ref 3.5–5.2)
Sodium: 143 mmol/L (ref 134–144)
Total Protein: 6.6 g/dL (ref 6.0–8.5)
eGFR: 99 mL/min/1.73

## 2021-08-03 LAB — CBC
Hematocrit: 41.5 % (ref 34.0–46.6)
Hemoglobin: 13.9 g/dL (ref 11.1–15.9)
MCH: 27.5 pg (ref 26.6–33.0)
MCHC: 33.5 g/dL (ref 31.5–35.7)
MCV: 82 fL (ref 79–97)
Platelets: 250 10*3/uL (ref 150–450)
RBC: 5.06 x10E6/uL (ref 3.77–5.28)
RDW: 13.5 % (ref 11.7–15.4)
WBC: 11.4 10*3/uL — ABNORMAL HIGH (ref 3.4–10.8)

## 2021-08-03 NOTE — Telephone Encounter (Signed)
-----   Message from Georgeanna Lea, MD sent at 08/03/2021 10:45 AM EST ----- Labs are fine only minimal elevation of liver function test not to the level to be concerned.

## 2021-08-03 NOTE — Telephone Encounter (Signed)
Clearance request from [facility] is on Dr. Raliegh Ip  desk to be reviewed. This is to be cleared to start weight loss meds

## 2021-08-03 NOTE — Telephone Encounter (Signed)
Patient notified of results.

## 2021-08-06 NOTE — Telephone Encounter (Signed)
° °  Patient Name: Allison Pacheco  DOB: Oct 26, 1960 MRN: [id number]  Primary Cardiologist: Gypsy Balsam, MD  Chart reviewed as part of pre-operative protocol coverage. Based on ACC/AHA guidelines, [name] would be at acceptable risk for the planned procedure without further cardiovascular testing.   Original intake request did not specify the need to hold aspirin. Given prior cardiac history including occluded LAD by remote catheterization, would recommend to continue uninterrupted if able.  Will route this bundled recommendation to requesting provider via Epic fax function. Please call with questions.  Laurann Montana, PA-C 08/06/2021, 8:28 AM

## 2021-08-09 DIAGNOSIS — I2510 Atherosclerotic heart disease of native coronary artery without angina pectoris: Secondary | ICD-10-CM | POA: Diagnosis not present

## 2021-08-09 DIAGNOSIS — Z713 Dietary counseling and surveillance: Secondary | ICD-10-CM | POA: Diagnosis not present

## 2021-08-09 DIAGNOSIS — E785 Hyperlipidemia, unspecified: Secondary | ICD-10-CM | POA: Diagnosis not present

## 2021-08-09 DIAGNOSIS — E639 Nutritional deficiency, unspecified: Secondary | ICD-10-CM | POA: Diagnosis not present

## 2021-08-13 DIAGNOSIS — Z6838 Body mass index (BMI) 38.0-38.9, adult: Secondary | ICD-10-CM | POA: Diagnosis not present

## 2021-08-13 DIAGNOSIS — R2241 Localized swelling, mass and lump, right lower limb: Secondary | ICD-10-CM | POA: Diagnosis not present

## 2021-08-13 DIAGNOSIS — D1723 Benign lipomatous neoplasm of skin and subcutaneous tissue of right leg: Secondary | ICD-10-CM | POA: Diagnosis not present

## 2021-08-13 DIAGNOSIS — D1724 Benign lipomatous neoplasm of skin and subcutaneous tissue of left leg: Secondary | ICD-10-CM | POA: Diagnosis not present

## 2021-08-16 DIAGNOSIS — M2550 Pain in unspecified joint: Secondary | ICD-10-CM | POA: Diagnosis not present

## 2021-08-16 DIAGNOSIS — R5383 Other fatigue: Secondary | ICD-10-CM | POA: Diagnosis not present

## 2021-08-16 DIAGNOSIS — Z713 Dietary counseling and surveillance: Secondary | ICD-10-CM | POA: Diagnosis not present

## 2021-08-23 DIAGNOSIS — Z713 Dietary counseling and surveillance: Secondary | ICD-10-CM | POA: Diagnosis not present

## 2021-08-23 DIAGNOSIS — M2550 Pain in unspecified joint: Secondary | ICD-10-CM | POA: Diagnosis not present

## 2021-08-23 DIAGNOSIS — E639 Nutritional deficiency, unspecified: Secondary | ICD-10-CM | POA: Diagnosis not present

## 2021-08-23 DIAGNOSIS — I2510 Atherosclerotic heart disease of native coronary artery without angina pectoris: Secondary | ICD-10-CM | POA: Diagnosis not present

## 2021-08-23 DIAGNOSIS — E785 Hyperlipidemia, unspecified: Secondary | ICD-10-CM | POA: Diagnosis not present

## 2021-08-30 DIAGNOSIS — Z713 Dietary counseling and surveillance: Secondary | ICD-10-CM | POA: Diagnosis not present

## 2021-08-30 DIAGNOSIS — E639 Nutritional deficiency, unspecified: Secondary | ICD-10-CM | POA: Diagnosis not present

## 2021-08-30 DIAGNOSIS — M2550 Pain in unspecified joint: Secondary | ICD-10-CM | POA: Diagnosis not present

## 2021-08-30 DIAGNOSIS — E785 Hyperlipidemia, unspecified: Secondary | ICD-10-CM | POA: Diagnosis not present

## 2021-09-03 ENCOUNTER — Telehealth: Payer: Self-pay

## 2021-09-03 NOTE — Telephone Encounter (Signed)
? ?  [facility] Pre-operative Risk Assessment  ?  ?Request for surgical clearance: ? ?What type of surgery is being performed? Medication Clearance (Phendimetrazine or Phentermine)  ? ?When is this surgery scheduled? TBD  ? ?What type of clearance is required (medical clearance vs. Pharmacy clearance to hold med vs. Both)? Pharmacy ? ?Are there any medications that need to be held prior to surgery and how long?Not specify  ? ?Practice name and name of physician performing surgery? Clearance Coots NP  ? ?What is your office phone number: [phone]  ?  ?7.   What is your office fax number: [phone] ? ?8.   Anesthesia type (None, local, MAC, general) ? Not specified ?  Because of hx MI and cardiac issues, there asking if the patient can take the following on question 1 ? ?Allison Pacheco M Allison Pacheco ?09/03/2021, 8:29 AM  ?_________________________________________________________________ ?  (provider comments below) ? ?

## 2021-09-03 NOTE — Telephone Encounter (Signed)
The question appears to be related to weight loss medications rather than to clear the patient for surgery. Not sure why it was sent to preop pool. Will forward question to primary cardiologist Dr. Bing Matter ?

## 2021-09-05 NOTE — Telephone Encounter (Signed)
Will forward to triage as this is not a cardiac clearance and was sent to preop pool by mistake. Per Dr. Bing Matter: " In her situation with history of MI in the past that clearly carry some risk.  So she need to make a decision with her prescribing physician what of the benefits and what of the potential risks.  I think the best way to lose weight would be to be more active and exercise on the regular basis and diet rather than take medications. " ?

## 2021-09-06 DIAGNOSIS — I2510 Atherosclerotic heart disease of native coronary artery without angina pectoris: Secondary | ICD-10-CM | POA: Diagnosis not present

## 2021-09-06 DIAGNOSIS — N3281 Overactive bladder: Secondary | ICD-10-CM | POA: Diagnosis not present

## 2021-09-06 DIAGNOSIS — E785 Hyperlipidemia, unspecified: Secondary | ICD-10-CM | POA: Diagnosis not present

## 2021-09-06 DIAGNOSIS — Z713 Dietary counseling and surveillance: Secondary | ICD-10-CM | POA: Diagnosis not present

## 2021-09-06 NOTE — Telephone Encounter (Signed)
Spoke with the patient, notified of the following per Dr. Bing Matter. She verbalized understanding.  ?

## 2021-09-13 DIAGNOSIS — R5383 Other fatigue: Secondary | ICD-10-CM | POA: Diagnosis not present

## 2021-09-13 DIAGNOSIS — E785 Hyperlipidemia, unspecified: Secondary | ICD-10-CM | POA: Diagnosis not present

## 2021-09-13 DIAGNOSIS — E639 Nutritional deficiency, unspecified: Secondary | ICD-10-CM | POA: Diagnosis not present

## 2021-09-13 DIAGNOSIS — Z713 Dietary counseling and surveillance: Secondary | ICD-10-CM | POA: Diagnosis not present

## 2021-09-20 DIAGNOSIS — I2510 Atherosclerotic heart disease of native coronary artery without angina pectoris: Secondary | ICD-10-CM | POA: Diagnosis not present

## 2021-09-20 DIAGNOSIS — E639 Nutritional deficiency, unspecified: Secondary | ICD-10-CM | POA: Diagnosis not present

## 2021-09-20 DIAGNOSIS — Z713 Dietary counseling and surveillance: Secondary | ICD-10-CM | POA: Diagnosis not present

## 2021-09-20 DIAGNOSIS — E785 Hyperlipidemia, unspecified: Secondary | ICD-10-CM | POA: Diagnosis not present

## 2021-10-04 DIAGNOSIS — E785 Hyperlipidemia, unspecified: Secondary | ICD-10-CM | POA: Diagnosis not present

## 2021-10-04 DIAGNOSIS — Z713 Dietary counseling and surveillance: Secondary | ICD-10-CM | POA: Diagnosis not present

## 2021-10-04 DIAGNOSIS — E639 Nutritional deficiency, unspecified: Secondary | ICD-10-CM | POA: Diagnosis not present

## 2021-10-04 DIAGNOSIS — I2510 Atherosclerotic heart disease of native coronary artery without angina pectoris: Secondary | ICD-10-CM | POA: Diagnosis not present

## 2021-10-11 DIAGNOSIS — E639 Nutritional deficiency, unspecified: Secondary | ICD-10-CM | POA: Diagnosis not present

## 2021-10-11 DIAGNOSIS — E785 Hyperlipidemia, unspecified: Secondary | ICD-10-CM | POA: Diagnosis not present

## 2021-10-11 DIAGNOSIS — Z713 Dietary counseling and surveillance: Secondary | ICD-10-CM | POA: Diagnosis not present

## 2021-10-11 DIAGNOSIS — I2510 Atherosclerotic heart disease of native coronary artery without angina pectoris: Secondary | ICD-10-CM | POA: Diagnosis not present

## 2021-10-31 DIAGNOSIS — Z6834 Body mass index (BMI) 34.0-34.9, adult: Secondary | ICD-10-CM | POA: Diagnosis not present

## 2021-10-31 DIAGNOSIS — E785 Hyperlipidemia, unspecified: Secondary | ICD-10-CM | POA: Diagnosis not present

## 2021-10-31 DIAGNOSIS — Z0001 Encounter for general adult medical examination with abnormal findings: Secondary | ICD-10-CM | POA: Diagnosis not present

## 2021-11-08 DIAGNOSIS — M1712 Unilateral primary osteoarthritis, left knee: Secondary | ICD-10-CM | POA: Diagnosis not present

## 2021-12-03 DIAGNOSIS — Z1231 Encounter for screening mammogram for malignant neoplasm of breast: Secondary | ICD-10-CM | POA: Diagnosis not present

## 2022-03-05 DIAGNOSIS — J069 Acute upper respiratory infection, unspecified: Secondary | ICD-10-CM | POA: Diagnosis not present

## 2022-03-05 DIAGNOSIS — H8110 Benign paroxysmal vertigo, unspecified ear: Secondary | ICD-10-CM | POA: Diagnosis not present

## 2022-05-24 IMAGING — MR MR ANKLE*L* W/O CM
4 of 5 series · 13 of 40 positions shown · non-contrast
Comparison: Plain films of the left foot 01/05/2020.

CLINICAL DATA: Lateral left ankle pain radiating into the toes for
1 month. No known injury.

EXAM:
MRI OF THE LEFT ANKLE WITHOUT CONTRAST
TECHNIQUE: Multiplanar, multisequence MR imaging of the ankle was performed. No
intravenous contrast was administered.

[Series 3: PD fat-sat · axial · left · 3.0mm · 0.25mm/px · z∈[-82,+30]mm · 4 of 34 slices shown]
[im 1/34]
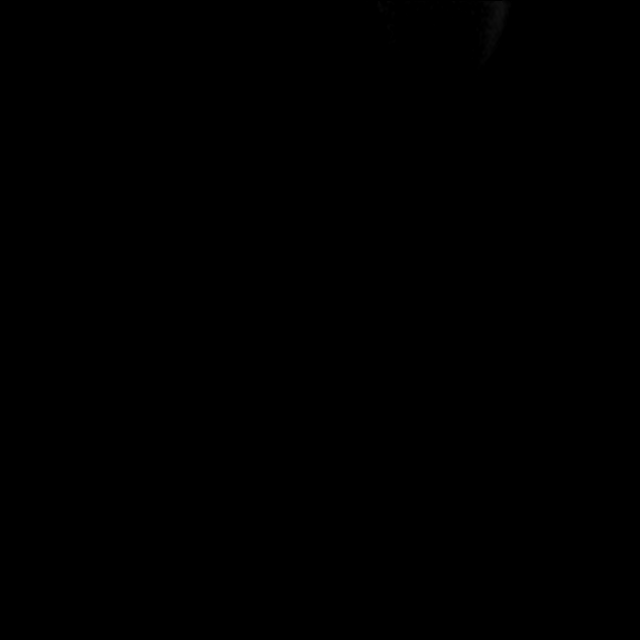
[im 5/34]
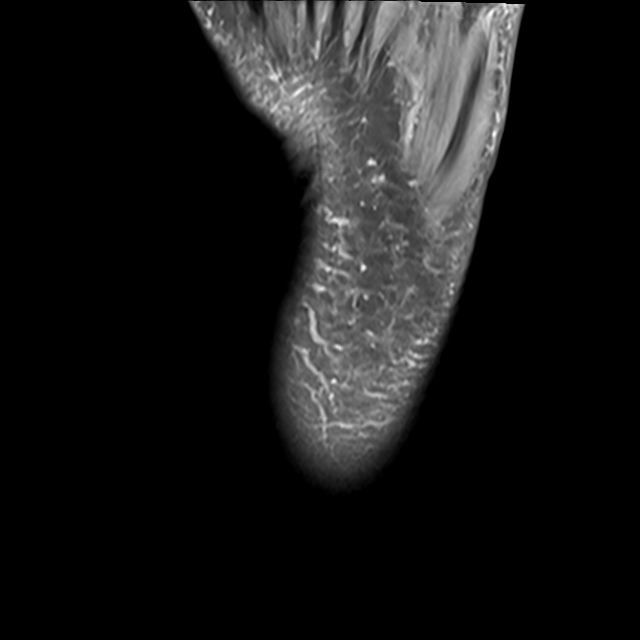
[im 17/34]
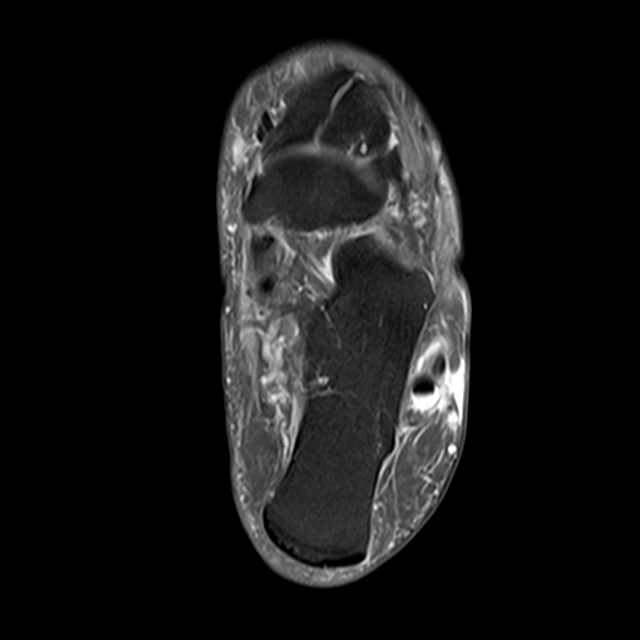
[im 29/34]
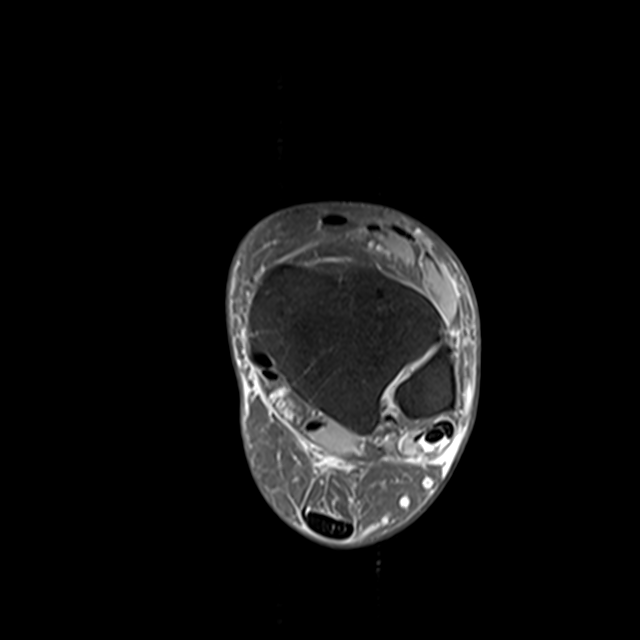

[Series 4: T2 fat-sat · axial · left · 3.0mm · 0.25mm/px · z∈[-66,+30]mm · 3 of 34 slices shown (1 of 2)]
[im 5/34]
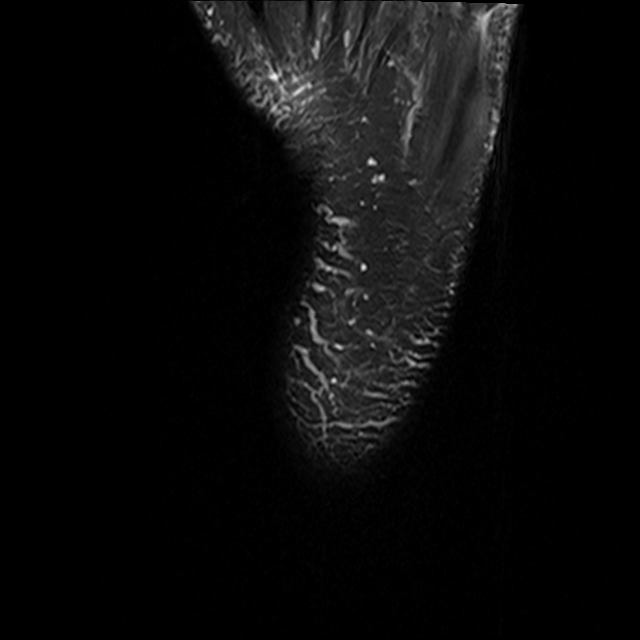
[im 17/34]
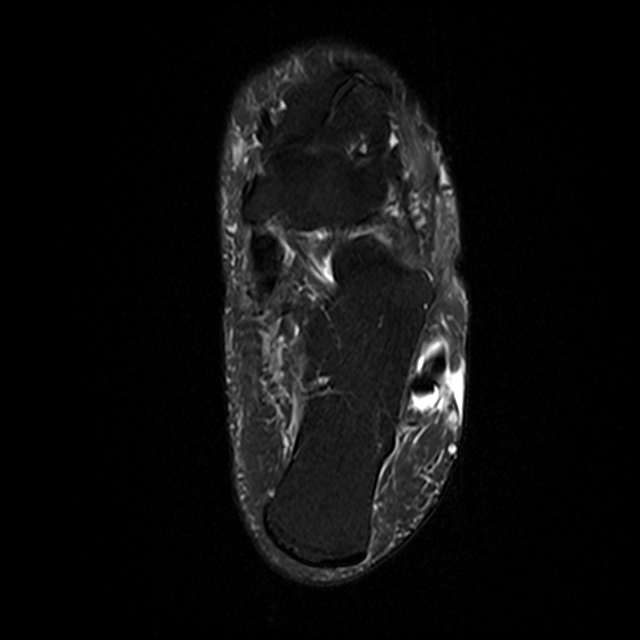
[im 29/34]
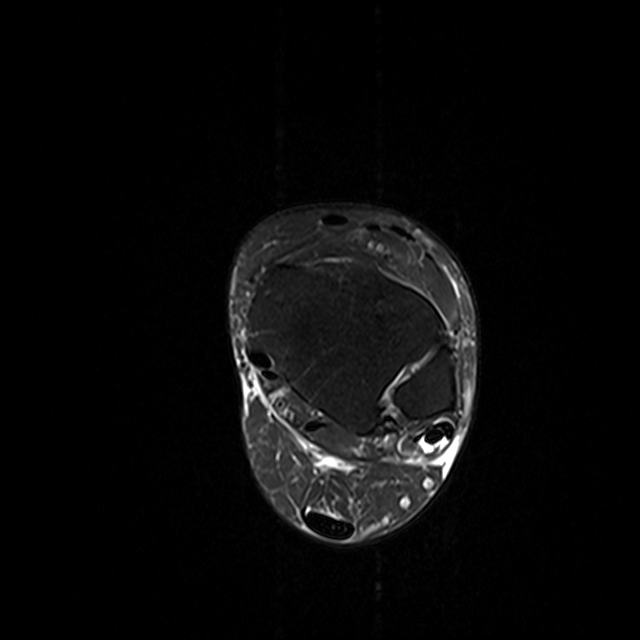

[Series 5: T1 · sagittal · left · 4.0mm · 0.27mm/px · 3 of 24 slices shown]
[im 5/24]
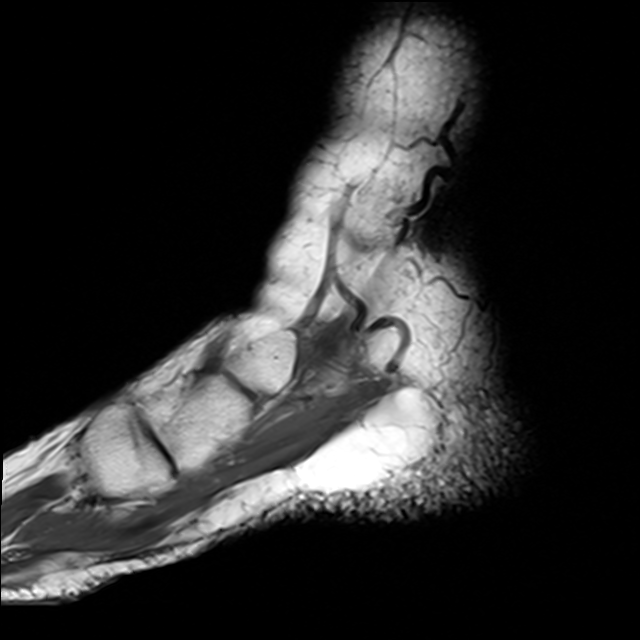
[im 14/24]
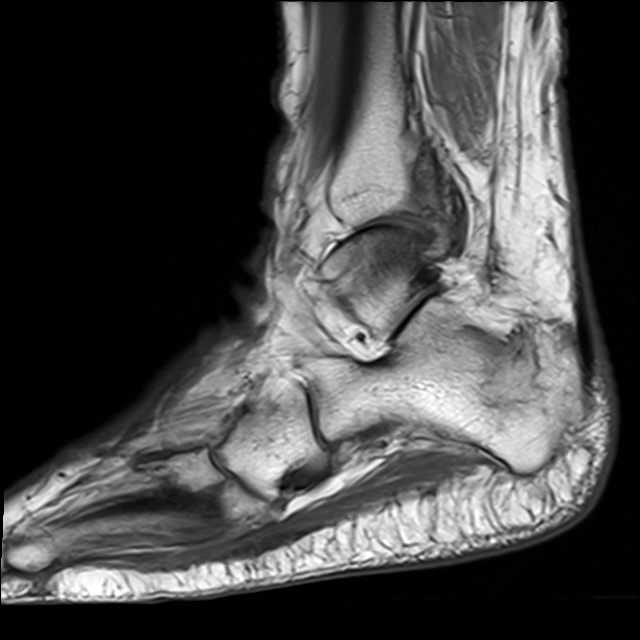
[im 24/24]
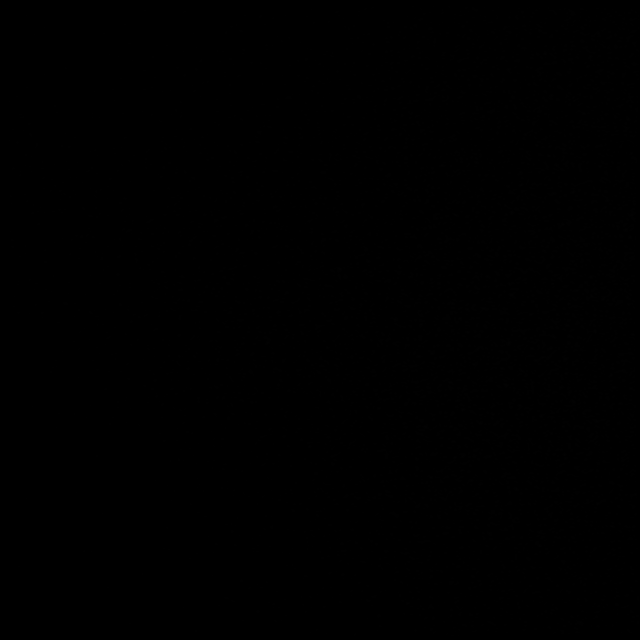

[Series 7: T2 fat-sat · coronal · left · 3.0mm · 0.25mm/px · 3 of 38 slices shown (2 of 2)]
[im 5/38]
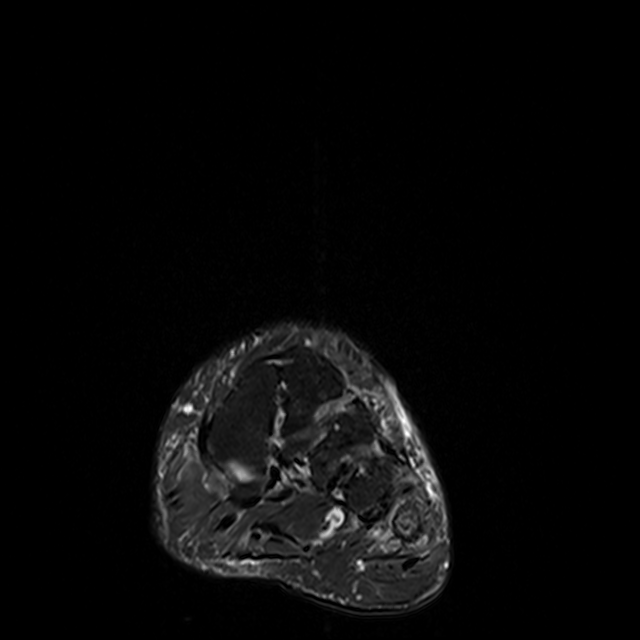
[im 21/38]
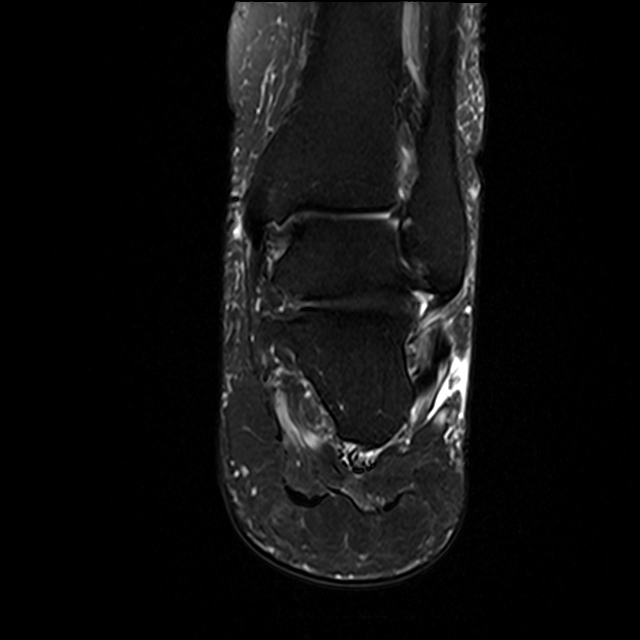
[im 33/38]
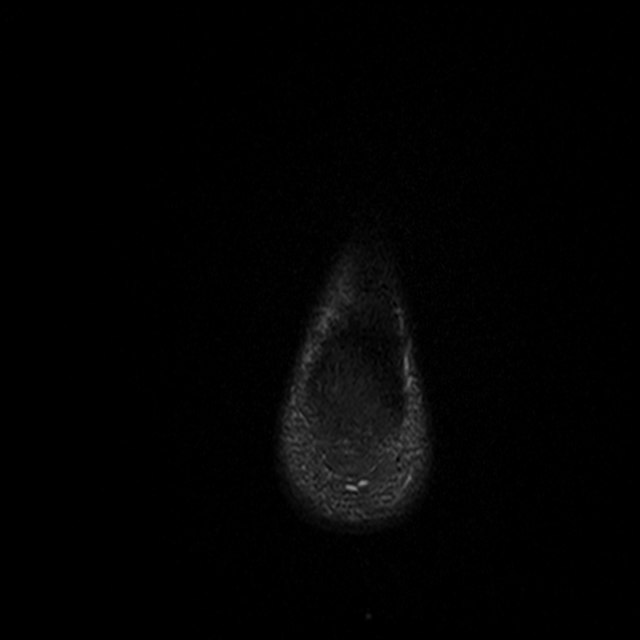

[13 of 40 positions shown; findings below may reference images not displayed]

FINDINGS: TENDONS

Peroneal: Intact. There is a moderate volume of fluid in the sheaths
of the tendons consistent with tenosynovitis.

Posteromedial: Intact.

Anterior: Intact.

Achilles: Intact.

Plantar Fascia: Intact with normal signal.

LIGAMENTS

Lateral: Intact.

Medial: Intact.

CARTILAGE

Ankle Joint: Normal. No osteochondral lesion of the talar dome or
joint effusion.

Subtalar Joints/Sinus Tarsi: Normal.

Bones: The patient has a nondisplaced fracture proximal fifth
metatarsal approximately 2 cm distal to the metatarsal base.
Fracture line is visible and there is mild marrow edema about the
fracture. There also appears to be some callus formation present
although no bridging bone is identified on MRI.

Midfoot osteoarthritis is also seen and appears worst at the fourth
tarsometatarsal joint.

Other: None.
IMPRESSION: Subacute appearing and nondisplaced fracture of the proximal fifth
metatarsal.

Fluid in the sheaths of the peroneal tendons consistent with
tenosynovitis. Negative for tendon tear.

Midfoot osteoarthritis.

## 2022-07-08 ENCOUNTER — Encounter: Payer: Self-pay | Admitting: Cardiology

## 2022-07-08 ENCOUNTER — Ambulatory Visit: Payer: Federal, State, Local not specified - PPO | Attending: Cardiology | Admitting: Cardiology

## 2022-07-08 VITALS — BP 104/70 | HR 85 | Ht 64.0 in | Wt 200.0 lb

## 2022-07-08 DIAGNOSIS — E781 Pure hyperglyceridemia: Secondary | ICD-10-CM

## 2022-07-08 DIAGNOSIS — E785 Hyperlipidemia, unspecified: Secondary | ICD-10-CM

## 2022-07-08 DIAGNOSIS — E782 Mixed hyperlipidemia: Secondary | ICD-10-CM

## 2022-07-08 DIAGNOSIS — I2510 Atherosclerotic heart disease of native coronary artery without angina pectoris: Secondary | ICD-10-CM | POA: Diagnosis not present

## 2022-07-08 NOTE — Progress Notes (Signed)
Cardiology Office Note:    Date:  07/08/2022   ID:  Allison Pacheco, DOB [birth year], MRN [id number]  PCP:  Marga Hoots, NP  Cardiologist:  Jenne Campus, MD    Referring MD: No ref. provider found   Chief Complaint  Patient presents with   Annual Exam    History of Present Illness:    Allison Pacheco is a 62 y.o. female with past medical history significant for coronary artery disease years ago she had cardiac catheterization which showed completely occluded distal LAD.  Since that time she is being stable without any reactivation of the problem.  Additional problem include dyslipidemia as well as hypertriglyceridemia. She is in my office today for follow-up.  Overall she is doing very well.  She denies have any chest pain tightness squeezing pressure burning chest.  She still tries to be active however latest activity being Difficult since she is moving to different house and she is busy taking care of those issues.  Past Medical History:  Diagnosis Date   Coronary artery disease    Coronary artery disease involving native coronary artery of native heart without angina pectoris 12/21/2015   Overview:  Completely occluded distal left anterior descending artery 1997   Dyslipidemia 12/21/2015   Gastroesophageal reflux disease without esophagitis 12/21/2015   GERD (gastroesophageal reflux disease)    Hyperlipidemia    Hypertriglyceridemia 12/21/2015    Past Surgical History:  Procedure Laterality Date   ABDOMINAL HYSTERECTOMY     CHOLECYSTECTOMY     SHOULDER SURGERY Right 2019   Rotator cuff repair   TONSILLECTOMY      Current Medications: Current Meds  Medication Sig   aspirin EC 81 MG tablet Take 81 mg by mouth daily. Swallow whole.   atorvastatin (LIPITOR) 10 MG tablet Take 1 tablet (10 mg total) by mouth daily.   doxycycline (ADOXA) 100 MG tablet Take 100 mg by mouth daily as needed (rosacea).   metroNIDAZOLE (METROCREAM) 0.75 % cream Apply 1 application  topically every morning.   oxybutynin (DITROPAN-XL) 5 MG 24 hr tablet Take 5 mg by mouth daily.     Allergies:   Codeine   Social History   Socioeconomic History   Marital status: Married    Spouse name: Not on file   Number of children: Not on file   Years of education: Not on file   Highest education level: Not on file  Occupational History   Not on file  Tobacco Use   Smoking status: Never   Smokeless tobacco: Never  Vaping Use   Vaping Use: Never used  Substance and Sexual Activity   Alcohol use: No    Alcohol/week: 0.0 standard drinks of alcohol   Drug use: No   Sexual activity: Not on file  Other Topics Concern   Not on file  Social History Narrative   Not on file   Social Determinants of Health   Financial Resource Strain: Not on file  Food Insecurity: Not on file  Transportation Needs: Not on file  Physical Activity: Not on file  Stress: Not on file  Social Connections: Not on file     Family History: The patient's family history includes Cancer in her father. ROS:   Please see the history of present illness.    All 14 point review of systems negative except as described per history of present illness  EKGs/Labs/Other Studies Reviewed:      Recent Labs: 08/02/2021: ALT 34; BUN 13; Creatinine, Ser 0.65; Hemoglobin 13.9; Platelets  250; Potassium 4.8; Sodium 143  Recent Lipid Panel    Component Value Date/Time   CHOL 138 11/03/2020 1205   TRIG 186 (H) 11/03/2020 1205   HDL 41 11/03/2020 1205   CHOLHDL 3.4 11/03/2020 1205   LDLCALC 66 11/03/2020 1205    Physical Exam:    VS:  BP 104/70 (BP Location: Left Arm, Patient Position: Sitting, Cuff Size: Large)   Pulse 85   Ht 5\' 4"  (1.626 m)   Wt 200 lb (90.7 kg)   SpO2 98%   BMI 34.33 kg/m     Wt Readings from Last 3 Encounters:  07/08/22 200 lb (90.7 kg)  06/06/21 225 lb 12.8 oz (102.4 kg)  11/03/20 222 lb (100.7 kg)     GEN:  Well nourished, well developed in no acute distress HEENT:  Normal NECK: No JVD; No carotid bruits LYMPHATICS: No lymphadenopathy CARDIAC: RRR, no murmurs, no rubs, no gallops RESPIRATORY:  Clear to auscultation without rales, wheezing or rhonchi  ABDOMEN: Soft, non-tender, non-distended MUSCULOSKELETAL:  No edema; No deformity  SKIN: Warm and dry LOWER EXTREMITIES: no swelling NEUROLOGIC:  Alert and oriented x 3 PSYCHIATRIC:  Normal affect   ASSESSMENT:    1. Coronary artery disease involving native coronary artery of native heart without angina pectoris   2. Dyslipidemia   3. Mixed hyperlipidemia   4. Hypertriglyceridemia    PLAN:    In order of problems listed above:  Coronary artery disease stable from that point review and appropriate medication which include antiplatelets therapy. Dyslipidemia I did review K PN her LDL was 92 HDL 42 she is taking Lipitor 10.  In her clinical scenario LDL need to be less than 70.  Therefore, I will ask her to have fasting lipid profile done and then most likely will double the dose of Lipitor. Hypertriglyceridemia exercise and good diet is in order. Mitral regurgitation there is incomplete information about echocardiogram echocardiogram from few years ago show mild to moderate mitral regurgitation, last echocardiogram did not mention anything about mitral regurgitation, will do echocardiogram to clarify the tissue   Medication Adjustments/Labs and Tests Ordered: Current medicines are reviewed at length with the patient today.  Concerns regarding medicines are outlined above.  No orders of the defined types were placed in this encounter.  Medication changes: No orders of the defined types were placed in this encounter.   Signed, Park Liter, MD, [facility] 07/08/2022 9:35 AM    [place]

## 2022-07-08 NOTE — Addendum Note (Signed)
Addended by: Truddie Hidden on: 07/08/2022 09:43 AM   Modules accepted: Orders

## 2022-07-08 NOTE — Patient Instructions (Signed)
Medication Instructions:  Your physician recommends that you continue on your current medications as directed. Please refer to the Current Medication list given to you today.  *If you need a refill on your cardiac medications before your next appointment, please call your pharmacy*   Lab Work: Your physician recommends that you have a AST, ALT and lipid done today.  If you have labs (blood work) drawn today and your tests are completely normal, you will receive your results only by: [place] (if you have MyChart) OR A paper copy in the mail If you have any lab test that is abnormal or we need to change your treatment, we will call you to review the results.   Testing/Procedures: Your physician has requested that you have an echocardiogram. Echocardiography is a painless test that uses sound waves to create images of your heart. It provides your doctor with information about the size and shape of your heart and how well your heart's chambers and valves are working. This procedure takes approximately one hour. There are no restrictions for this procedure. Please do NOT wear cologne, perfume, aftershave, or lotions (deodorant is allowed). Please arrive 15 minutes prior to your appointment time.   Follow-Up: At [facility], you and your health needs are our priority.  As part of our continuing mission to provide you with exceptional heart care, we have created designated Provider Care Teams.  These Care Teams include your primary Cardiologist (physician) and Advanced Practice Providers (APPs -  Physician Assistants and Nurse Practitioners) who all work together to provide you with the care you need, when you need it.  We recommend signing up for the patient portal called "MyChart".  Sign up information is provided on this After Visit Summary.  MyChart is used to connect with patients for Virtual Visits (Telemedicine).  Patients are able to view lab/test results, encounter notes,  upcoming appointments, etc.  Non-urgent messages can be sent to your provider as well.   To learn more about what you can do with MyChart, go to [email or web address].    Your next appointment:   12 month(s)  The format for your next appointment:   In Person  Provider:   Jenne Campus, MD   Other Instructions Echocardiogram An echocardiogram is a test that uses sound waves (ultrasound) to produce images of the heart. Images from an echocardiogram can provide important information about: Heart size and shape. The size and thickness and movement of your heart's walls. Heart muscle function and strength. Heart valve function or if you have stenosis. Stenosis is when the heart valves are too narrow. If blood is flowing backward through the heart valves (regurgitation). A tumor or infectious growth around the heart valves. Areas of heart muscle that are not working well because of poor blood flow or injury from a heart attack. Aneurysm detection. An aneurysm is a weak or damaged part of an artery wall. The wall bulges out from the normal force of blood pumping through the body. Tell a health care provider about: Any allergies you have. All medicines you are taking, including vitamins, herbs, eye drops, creams, and over-the-counter medicines. Any blood disorders you have. Any surgeries you have had. Any medical conditions you have. Whether you are pregnant or may be pregnant. What are the risks? Generally, this is a safe test. However, problems may occur, including an allergic reaction to dye (contrast) that may be used during the test. What happens before the test? No specific preparation is needed. You may eat  and drink normally. What happens during the test? You will take off your clothes from the waist up and put on a hospital gown. Electrodes or electrocardiogram (ECG)patches may be placed on your chest. The electrodes or patches are then connected to a device that monitors  your heart rate and rhythm. You will lie down on a table for an ultrasound exam. A gel will be applied to your chest to help sound waves pass through your skin. A handheld device, called a transducer, will be pressed against your chest and moved over your heart. The transducer produces sound waves that travel to your heart and bounce back (or "echo" back) to the transducer. These sound waves will be captured in real-time and changed into images of your heart that can be viewed on a video monitor. The images will be recorded on a computer and reviewed by your health care provider. You may be asked to change positions or hold your breath for a short time. This makes it easier to get different views or better views of your heart. In some cases, you may receive contrast through an IV in one of your veins. This can improve the quality of the pictures from your heart. The procedure may vary among health care providers and hospitals.   What can I expect after the test? You may return to your normal, everyday life, including diet, activities, and medicines, unless your health care provider tells you not to do that. Follow these instructions at home: It is up to you to get the results of your test. Ask your health care provider, or the department that is doing the test, when your results will be ready. Keep all follow-up visits. This is important. Summary An echocardiogram is a test that uses sound waves (ultrasound) to produce images of the heart. Images from an echocardiogram can provide important information about the size and shape of your heart, heart muscle function, heart valve function, and other possible heart problems. You do not need to do anything to prepare before this test. You may eat and drink normally. After the echocardiogram is completed, you may return to your normal, everyday life, unless your health care provider tells you not to do that. This information is not intended to replace  advice given to you by your health care provider. Make sure you discuss any questions you have with your health care provider. Document Revised: 02/01/2020 Document Reviewed: 02/01/2020 Elsevier Patient Education  [place]

## 2022-07-11 LAB — LIPID PANEL
Chol/HDL Ratio: 3.3 ratio (ref 0.0–4.4)
Cholesterol, Total: 144 mg/dL (ref 100–199)
HDL: 44 mg/dL (ref >39)
LDL Chol Calc (NIH): 78 mg/dL (ref 0–99)
Triglycerides: 120 mg/dL (ref 0–149)
VLDL Cholesterol Cal: 22 mg/dL (ref 5–40)

## 2022-07-11 LAB — ALT: ALT: 16 IU/L (ref 0–32)

## 2022-07-11 LAB — AST: AST: 18 IU/L (ref 0–40)

## 2022-07-12 ENCOUNTER — Telehealth: Payer: Self-pay

## 2022-07-12 NOTE — Telephone Encounter (Signed)
Results reviewed with pt as per Dr. Krasowski's note.  Pt verbalized understanding and had no additional questions. Routed to PCP  

## 2022-08-08 ENCOUNTER — Telehealth: Payer: Self-pay | Admitting: Cardiology

## 2022-08-08 NOTE — Telephone Encounter (Signed)
   Pre-operative Risk Assessment    Patient Name: Allison Pacheco  DOB: 1960/08/01 MRN: [id number]     Request for Surgical Clearance    Procedure:   Oral Surgery  Date of Surgery:  Clearance TBD                                 Surgeon:  Paulette Blanch Surgeon's Group or Practice Name:  [facility] [place]  Phone number:  [phone] Fax number:  [phone]   Type of Clearance Requested:   - Medical  - Pharmacy:  Hold Aspirin     Type of Anesthesia:  Local    Additional requests/questions:      Gaylyn Lambert   08/08/2022, 3:26 PM

## 2022-08-09 NOTE — Telephone Encounter (Signed)
Please clarify procedure.

## 2022-08-09 NOTE — Telephone Encounter (Signed)
Placed call to dentist office, left message for the surgery scheduler to call back.

## 2022-08-12 ENCOUNTER — Ambulatory Visit: Payer: Federal, State, Local not specified - PPO | Attending: Cardiology

## 2022-08-12 DIAGNOSIS — I2510 Atherosclerotic heart disease of native coronary artery without angina pectoris: Secondary | ICD-10-CM

## 2022-08-12 LAB — ECHOCARDIOGRAM COMPLETE
Area-P 1/2: 3.27 cm2
S' Lateral: 3.1 cm

## 2022-08-12 NOTE — Telephone Encounter (Signed)
I s/w the DDS office and clarified procedure to be done.   PROCEDURE: GUM SURGERY; SCALING & ROOT PLANING  ANESTHESIA: LOCAL

## 2022-08-12 NOTE — Telephone Encounter (Signed)
   Primary Cardiologist: Jenne Campus, MD  Chart reviewed as part of pre-operative protocol coverage. Simple dental extractions, cleanings, and scalings are considered low risk procedures per guidelines and generally do not require any specific cardiac clearance. It is also generally accepted that for simple extractions and dental cleanings, there is no need to interrupt blood thinner therapy.   SBE prophylaxis is not required for the patient.  Ideally aspirin should be continued without interruption, however if the bleeding risk is too great, aspirin may be held for 7 days prior to surgery. Please resume aspirin post operatively when it is felt to be safe from a bleeding standpoint.   I will route this recommendation to the requesting party via Epic fax function and remove from pre-op pool.  Please call with questions.  Emmaline Life, NP-C  08/12/2022, 2:51 PM [address] Office [phone] Fax [phone]

## 2022-08-13 NOTE — Progress Notes (Signed)
Exam observed by nicolle wallace

## 2022-08-14 ENCOUNTER — Telehealth: Payer: Self-pay

## 2022-08-14 NOTE — Telephone Encounter (Signed)
Results reviewed with pt as per Dr. Krasowski's note.  Pt verbalized understanding and had no additional questions. Routed to PCP  

## 2022-08-26 ENCOUNTER — Telehealth: Payer: Self-pay | Admitting: Cardiology

## 2022-08-26 NOTE — Telephone Encounter (Signed)
See clearance notes for further info 

## 2022-08-26 NOTE — Telephone Encounter (Signed)
[facility] called in stating they did not receive clearance, they asking that it be faxed over again to    [phone]

## 2022-08-26 NOTE — Telephone Encounter (Signed)
Requesting office called stating they have not received clearance notes. Clearance notes were faxed on 08/12/22 by Christen Bame, NP.   I will re-fax these notes again, to the fax # given today, which is the fax # on the clearance request as well. [phone].

## 2022-09-23 DIAGNOSIS — B029 Zoster without complications: Secondary | ICD-10-CM | POA: Diagnosis not present

## 2022-11-08 DIAGNOSIS — L718 Other rosacea: Secondary | ICD-10-CM | POA: Diagnosis not present

## 2022-11-08 DIAGNOSIS — Z7189 Other specified counseling: Secondary | ICD-10-CM | POA: Diagnosis not present

## 2022-11-11 DIAGNOSIS — M1712 Unilateral primary osteoarthritis, left knee: Secondary | ICD-10-CM | POA: Diagnosis not present

## 2022-11-11 DIAGNOSIS — M25462 Effusion, left knee: Secondary | ICD-10-CM | POA: Diagnosis not present

## 2022-11-11 DIAGNOSIS — M25562 Pain in left knee: Secondary | ICD-10-CM | POA: Diagnosis not present

## 2023-01-03 ENCOUNTER — Other Ambulatory Visit: Payer: Self-pay | Admitting: Cardiology

## 2023-01-09 DIAGNOSIS — L718 Other rosacea: Secondary | ICD-10-CM | POA: Diagnosis not present

## 2023-01-09 DIAGNOSIS — Z7189 Other specified counseling: Secondary | ICD-10-CM | POA: Diagnosis not present

## 2023-01-29 DIAGNOSIS — M25562 Pain in left knee: Secondary | ICD-10-CM | POA: Diagnosis not present

## 2023-01-29 DIAGNOSIS — M25561 Pain in right knee: Secondary | ICD-10-CM | POA: Diagnosis not present

## 2023-01-29 DIAGNOSIS — M170 Bilateral primary osteoarthritis of knee: Secondary | ICD-10-CM | POA: Diagnosis not present

## 2023-01-29 DIAGNOSIS — R262 Difficulty in walking, not elsewhere classified: Secondary | ICD-10-CM | POA: Diagnosis not present

## 2023-02-05 DIAGNOSIS — M170 Bilateral primary osteoarthritis of knee: Secondary | ICD-10-CM | POA: Diagnosis not present

## 2023-02-05 DIAGNOSIS — M25562 Pain in left knee: Secondary | ICD-10-CM | POA: Diagnosis not present

## 2023-02-05 DIAGNOSIS — M25561 Pain in right knee: Secondary | ICD-10-CM | POA: Diagnosis not present

## 2023-02-05 DIAGNOSIS — R262 Difficulty in walking, not elsewhere classified: Secondary | ICD-10-CM | POA: Diagnosis not present

## 2023-02-21 DIAGNOSIS — M25562 Pain in left knee: Secondary | ICD-10-CM | POA: Diagnosis not present

## 2023-02-21 DIAGNOSIS — M170 Bilateral primary osteoarthritis of knee: Secondary | ICD-10-CM | POA: Diagnosis not present

## 2023-02-21 DIAGNOSIS — R262 Difficulty in walking, not elsewhere classified: Secondary | ICD-10-CM | POA: Diagnosis not present

## 2023-02-21 DIAGNOSIS — M25561 Pain in right knee: Secondary | ICD-10-CM | POA: Diagnosis not present

## 2023-03-13 DIAGNOSIS — M9902 Segmental and somatic dysfunction of thoracic region: Secondary | ICD-10-CM | POA: Diagnosis not present

## 2023-03-13 DIAGNOSIS — L718 Other rosacea: Secondary | ICD-10-CM | POA: Diagnosis not present

## 2023-03-13 DIAGNOSIS — L814 Other melanin hyperpigmentation: Secondary | ICD-10-CM | POA: Diagnosis not present

## 2023-03-13 DIAGNOSIS — L821 Other seborrheic keratosis: Secondary | ICD-10-CM | POA: Diagnosis not present

## 2023-03-13 DIAGNOSIS — D225 Melanocytic nevi of trunk: Secondary | ICD-10-CM | POA: Diagnosis not present

## 2023-03-13 DIAGNOSIS — L570 Actinic keratosis: Secondary | ICD-10-CM | POA: Diagnosis not present

## 2023-03-13 DIAGNOSIS — M9903 Segmental and somatic dysfunction of lumbar region: Secondary | ICD-10-CM | POA: Diagnosis not present

## 2023-03-13 DIAGNOSIS — M9901 Segmental and somatic dysfunction of cervical region: Secondary | ICD-10-CM | POA: Diagnosis not present

## 2023-03-18 DIAGNOSIS — M9902 Segmental and somatic dysfunction of thoracic region: Secondary | ICD-10-CM | POA: Diagnosis not present

## 2023-03-18 DIAGNOSIS — M9903 Segmental and somatic dysfunction of lumbar region: Secondary | ICD-10-CM | POA: Diagnosis not present

## 2023-03-18 DIAGNOSIS — M9901 Segmental and somatic dysfunction of cervical region: Secondary | ICD-10-CM | POA: Diagnosis not present

## 2023-03-24 DIAGNOSIS — M1712 Unilateral primary osteoarthritis, left knee: Secondary | ICD-10-CM | POA: Diagnosis not present

## 2023-03-25 DIAGNOSIS — M9901 Segmental and somatic dysfunction of cervical region: Secondary | ICD-10-CM | POA: Diagnosis not present

## 2023-03-25 DIAGNOSIS — M9902 Segmental and somatic dysfunction of thoracic region: Secondary | ICD-10-CM | POA: Diagnosis not present

## 2023-03-25 DIAGNOSIS — M9903 Segmental and somatic dysfunction of lumbar region: Secondary | ICD-10-CM | POA: Diagnosis not present

## 2023-03-27 DIAGNOSIS — M9902 Segmental and somatic dysfunction of thoracic region: Secondary | ICD-10-CM | POA: Diagnosis not present

## 2023-03-27 DIAGNOSIS — M9903 Segmental and somatic dysfunction of lumbar region: Secondary | ICD-10-CM | POA: Diagnosis not present

## 2023-03-27 DIAGNOSIS — M9901 Segmental and somatic dysfunction of cervical region: Secondary | ICD-10-CM | POA: Diagnosis not present

## 2023-04-03 DIAGNOSIS — M9901 Segmental and somatic dysfunction of cervical region: Secondary | ICD-10-CM | POA: Diagnosis not present

## 2023-04-03 DIAGNOSIS — M9902 Segmental and somatic dysfunction of thoracic region: Secondary | ICD-10-CM | POA: Diagnosis not present

## 2023-04-03 DIAGNOSIS — M9903 Segmental and somatic dysfunction of lumbar region: Secondary | ICD-10-CM | POA: Diagnosis not present

## 2023-04-17 DIAGNOSIS — M9902 Segmental and somatic dysfunction of thoracic region: Secondary | ICD-10-CM | POA: Diagnosis not present

## 2023-04-17 DIAGNOSIS — M9903 Segmental and somatic dysfunction of lumbar region: Secondary | ICD-10-CM | POA: Diagnosis not present

## 2023-04-17 DIAGNOSIS — M9901 Segmental and somatic dysfunction of cervical region: Secondary | ICD-10-CM | POA: Diagnosis not present

## 2023-04-24 DIAGNOSIS — M9903 Segmental and somatic dysfunction of lumbar region: Secondary | ICD-10-CM | POA: Diagnosis not present

## 2023-04-24 DIAGNOSIS — M9902 Segmental and somatic dysfunction of thoracic region: Secondary | ICD-10-CM | POA: Diagnosis not present

## 2023-04-24 DIAGNOSIS — M9901 Segmental and somatic dysfunction of cervical region: Secondary | ICD-10-CM | POA: Diagnosis not present

## 2023-05-01 DIAGNOSIS — M9901 Segmental and somatic dysfunction of cervical region: Secondary | ICD-10-CM | POA: Diagnosis not present

## 2023-05-01 DIAGNOSIS — M9902 Segmental and somatic dysfunction of thoracic region: Secondary | ICD-10-CM | POA: Diagnosis not present

## 2023-05-01 DIAGNOSIS — M9903 Segmental and somatic dysfunction of lumbar region: Secondary | ICD-10-CM | POA: Diagnosis not present

## 2023-05-15 DIAGNOSIS — M9901 Segmental and somatic dysfunction of cervical region: Secondary | ICD-10-CM | POA: Diagnosis not present

## 2023-05-15 DIAGNOSIS — M9902 Segmental and somatic dysfunction of thoracic region: Secondary | ICD-10-CM | POA: Diagnosis not present

## 2023-05-15 DIAGNOSIS — M9903 Segmental and somatic dysfunction of lumbar region: Secondary | ICD-10-CM | POA: Diagnosis not present

## 2023-05-19 DIAGNOSIS — M1712 Unilateral primary osteoarthritis, left knee: Secondary | ICD-10-CM | POA: Diagnosis not present

## 2023-05-29 DIAGNOSIS — M9901 Segmental and somatic dysfunction of cervical region: Secondary | ICD-10-CM | POA: Diagnosis not present

## 2023-05-29 DIAGNOSIS — M9903 Segmental and somatic dysfunction of lumbar region: Secondary | ICD-10-CM | POA: Diagnosis not present

## 2023-05-29 DIAGNOSIS — M9902 Segmental and somatic dysfunction of thoracic region: Secondary | ICD-10-CM | POA: Diagnosis not present

## 2023-06-10 DIAGNOSIS — M9902 Segmental and somatic dysfunction of thoracic region: Secondary | ICD-10-CM | POA: Diagnosis not present

## 2023-06-10 DIAGNOSIS — M9903 Segmental and somatic dysfunction of lumbar region: Secondary | ICD-10-CM | POA: Diagnosis not present

## 2023-06-10 DIAGNOSIS — M9901 Segmental and somatic dysfunction of cervical region: Secondary | ICD-10-CM | POA: Diagnosis not present

## 2023-07-01 DIAGNOSIS — M9901 Segmental and somatic dysfunction of cervical region: Secondary | ICD-10-CM | POA: Diagnosis not present

## 2023-07-01 DIAGNOSIS — M9903 Segmental and somatic dysfunction of lumbar region: Secondary | ICD-10-CM | POA: Diagnosis not present

## 2023-07-01 DIAGNOSIS — M9902 Segmental and somatic dysfunction of thoracic region: Secondary | ICD-10-CM | POA: Diagnosis not present

## 2023-07-15 DIAGNOSIS — M9901 Segmental and somatic dysfunction of cervical region: Secondary | ICD-10-CM | POA: Diagnosis not present

## 2023-07-15 DIAGNOSIS — M9902 Segmental and somatic dysfunction of thoracic region: Secondary | ICD-10-CM | POA: Diagnosis not present

## 2023-07-15 DIAGNOSIS — M9903 Segmental and somatic dysfunction of lumbar region: Secondary | ICD-10-CM | POA: Diagnosis not present

## 2023-07-21 DIAGNOSIS — M1712 Unilateral primary osteoarthritis, left knee: Secondary | ICD-10-CM | POA: Diagnosis not present

## 2023-07-29 DIAGNOSIS — M9903 Segmental and somatic dysfunction of lumbar region: Secondary | ICD-10-CM | POA: Diagnosis not present

## 2023-07-29 DIAGNOSIS — M9902 Segmental and somatic dysfunction of thoracic region: Secondary | ICD-10-CM | POA: Diagnosis not present

## 2023-07-29 DIAGNOSIS — M9901 Segmental and somatic dysfunction of cervical region: Secondary | ICD-10-CM | POA: Diagnosis not present

## 2023-08-12 DIAGNOSIS — M9902 Segmental and somatic dysfunction of thoracic region: Secondary | ICD-10-CM | POA: Diagnosis not present

## 2023-08-12 DIAGNOSIS — M9903 Segmental and somatic dysfunction of lumbar region: Secondary | ICD-10-CM | POA: Diagnosis not present

## 2023-08-12 DIAGNOSIS — M9901 Segmental and somatic dysfunction of cervical region: Secondary | ICD-10-CM | POA: Diagnosis not present

## 2023-08-26 DIAGNOSIS — M9903 Segmental and somatic dysfunction of lumbar region: Secondary | ICD-10-CM | POA: Diagnosis not present

## 2023-08-26 DIAGNOSIS — M9902 Segmental and somatic dysfunction of thoracic region: Secondary | ICD-10-CM | POA: Diagnosis not present

## 2023-08-26 DIAGNOSIS — M9901 Segmental and somatic dysfunction of cervical region: Secondary | ICD-10-CM | POA: Diagnosis not present

## 2023-09-02 DIAGNOSIS — N390 Urinary tract infection, site not specified: Secondary | ICD-10-CM | POA: Diagnosis not present

## 2023-09-09 DIAGNOSIS — M9901 Segmental and somatic dysfunction of cervical region: Secondary | ICD-10-CM | POA: Diagnosis not present

## 2023-09-09 DIAGNOSIS — M9903 Segmental and somatic dysfunction of lumbar region: Secondary | ICD-10-CM | POA: Diagnosis not present

## 2023-09-09 DIAGNOSIS — M9902 Segmental and somatic dysfunction of thoracic region: Secondary | ICD-10-CM | POA: Diagnosis not present

## 2023-10-01 DIAGNOSIS — M160 Bilateral primary osteoarthritis of hip: Secondary | ICD-10-CM | POA: Diagnosis not present

## 2023-10-01 DIAGNOSIS — M25551 Pain in right hip: Secondary | ICD-10-CM | POA: Diagnosis not present

## 2023-10-01 DIAGNOSIS — M7061 Trochanteric bursitis, right hip: Secondary | ICD-10-CM | POA: Diagnosis not present

## 2023-11-10 DIAGNOSIS — M7061 Trochanteric bursitis, right hip: Secondary | ICD-10-CM | POA: Diagnosis not present

## 2023-11-10 DIAGNOSIS — M1712 Unilateral primary osteoarthritis, left knee: Secondary | ICD-10-CM | POA: Diagnosis not present

## 2023-12-12 DIAGNOSIS — J069 Acute upper respiratory infection, unspecified: Secondary | ICD-10-CM | POA: Diagnosis not present

## 2023-12-31 DIAGNOSIS — H25043 Posterior subcapsular polar age-related cataract, bilateral: Secondary | ICD-10-CM | POA: Diagnosis not present

## 2023-12-31 DIAGNOSIS — H43812 Vitreous degeneration, left eye: Secondary | ICD-10-CM | POA: Diagnosis not present

## 2023-12-31 DIAGNOSIS — H2512 Age-related nuclear cataract, left eye: Secondary | ICD-10-CM | POA: Diagnosis not present

## 2023-12-31 DIAGNOSIS — H2513 Age-related nuclear cataract, bilateral: Secondary | ICD-10-CM | POA: Diagnosis not present
